# Patient Record
Sex: Male | Born: 1985 | Race: Asian | Hispanic: No | Marital: Single | Smoking: Current every day smoker
Health system: Southern US, Community
[De-identification: ages and names within clinical notes are randomized; demographics above are authoritative.]

## PROBLEM LIST (undated history)

## (undated) DIAGNOSIS — S43006A Unspecified dislocation of unspecified shoulder joint, initial encounter: Secondary | ICD-10-CM

---

## 2012-08-05 ENCOUNTER — Encounter (HOSPITAL_COMMUNITY): Payer: Self-pay | Admitting: Emergency Medicine

## 2012-08-05 ENCOUNTER — Emergency Department (INDEPENDENT_AMBULATORY_CARE_PROVIDER_SITE_OTHER): Payer: Self-pay

## 2012-08-05 ENCOUNTER — Emergency Department (INDEPENDENT_AMBULATORY_CARE_PROVIDER_SITE_OTHER)
Admission: EM | Admit: 2012-08-05 | Discharge: 2012-08-05 | Disposition: A | Discharge status: Home / Self Care | Payer: Self-pay | Source: Home / Self Care

## 2012-08-05 ENCOUNTER — Emergency Department (HOSPITAL_COMMUNITY): Payer: Self-pay

## 2012-08-05 DIAGNOSIS — S43006A Unspecified dislocation of unspecified shoulder joint, initial encounter: Secondary | ICD-10-CM

## 2012-08-05 MED ORDER — HYDROCODONE-ACETAMINOPHEN 5-325 MG PO TABS: 1.0000 | ORAL_TABLET | Freq: Four times a day (QID) | ORAL | Status: DC | PRN

## 2012-08-05 MED ORDER — HYDROMORPHONE HCL PF 1 MG/ML IJ SOLN
1.0000 mg | Freq: Once | INTRAMUSCULAR | Status: AC
  Administered 2012-08-05: 1 mg via INTRAMUSCULAR

## 2012-08-05 MED ORDER — ONDANSETRON HCL 4 MG/2ML IJ SOLN
INTRAMUSCULAR | Status: AC
Start: 2012-08-05 — End: 2012-08-05
  Filled 2012-08-05: qty 2

## 2012-08-05 MED ORDER — ONDANSETRON HCL 4 MG/2ML IJ SOLN
4.0000 mg | Freq: Once | INTRAMUSCULAR | Status: AC
  Administered 2012-08-05: 4 mg via INTRAMUSCULAR

## 2012-08-05 MED ORDER — HYDROMORPHONE HCL PF 1 MG/ML IJ SOLN
INTRAMUSCULAR | Status: AC
  Filled 2012-08-05: qty 1

## 2012-08-05 NOTE — ED Provider Notes (Signed)
Steven Richmond is a 27 y.o. male who presents to Urgent Care today for right shoulder pain. Patient fell last night at 10 PM resulted in an arm and shoulder pain. He presents to the urgent care today with shoulder pain and deformity. He notes severe right shoulder pain and right elbow pain. He denies any radiating pain weakness or numbness. Denies any history of  serious shoulder injury.    PMH reviewed.  History  Substance Use Topics  . Smoking status: Current Every Day Smoker -- 0.50 packs/day    Types: Cigarettes  . Smokeless tobacco: Not on file  . Alcohol Use: Yes     Comment: occasional   ROS as above Medications reviewed. Current Facility-Administered Medications  Medication Dose Route Frequency Provider Last Rate Last Dose  . HYDROmorphone (DILAUDID) injection 1 mg  1 mg Intramuscular Once Rodolph Bong, MD      . ondansetron [facility]) injection 4 mg  4 mg Intramuscular Once Rodolph Bong, MD       No current outpatient prescriptions on file.    Exam:  BP 152/102  Pulse 85  Resp 22  SpO2 100% Gen: Well NAD RIGHT SHOULDER:  Anterior dislocation with sulcus sign and deformity.  Sensation pulses capillary refill intact distal.   Procedure note:  Shoulder reduction.  Consent obtained and timeout performed. Right shoulder sulcus cleaned with Betadine and 10 mL of Marcaine were injected into the glenohumeral space.  Axial traction was applied and the arm is fully abducted 120 and attempted to internally rotate unsuccessfully.  The patient was then positioned sitting up and with his right knee and hip flexed. He interlaced his fingers and leaned back applying axial traction. This was also un-successful.  Axial traction was then manually applied and the arm is abducted to 130 slowly. The arm was then internally rotated to 90. The patient withdrew in pain.   We then took a break and provided the patient with 1 mg of IM allotted and 4 mg of IM Zofran.  Patient was placed supine  with his right arm holding a 15 pound weight.  After 10 minutes patient noted spontaneous reduction.   Normal grip strength and sensation following the reduction.    No results found for this or any previous visit (from the past 24 hour(s)). Dg Shoulder Right  08/05/2012  *RADIOLOGY REPORT*  Clinical Data: Shoulder pain with deformity status post fall 1 day ago.  RIGHT SHOULDER - 2+ VIEW  Comparison: None.  Findings: There is anterior dislocation of the glenohumeral joint. There is a possible Hill-Sachs deformity of the humeral head.  No displaced fracture fragment is identified.  IMPRESSION: Anterior dislocation of the glenohumeral joint with possible associated Hill-Sachs deformity.  Postreduction radiographs recommended.   Original Report Authenticated By: Carey Bullocks, M.D.     Assessment and Plan: 27 y.o. male with right shoulder dislocation with reduction.  Plan: Shoulder immobilizer for one week followup in one week to start internal rotation strengthening program Patient does not have insurance and therefore is unable to followup with orthopedics.       Rodolph Bong, MD 08/05/12 [phone]

## 2012-08-05 NOTE — ED Notes (Signed)
Pt injured right arm last night around 10 p.m, pt states that he fell in the kitchen landing on right arm/shoulder. Pt is unable to move arm/pain felt with movement.

## 2012-08-07 NOTE — ED Provider Notes (Signed)
Medical screening examination/treatment/procedure(s) were performed by resident physician or non-physician practitioner and as supervising physician I was immediately available for consultation/collaboration.   Lilo Wallington DOUGLAS MD.   Amantha Sklar D Shon Mansouri, MD 08/07/12 1120 

## 2012-10-08 ENCOUNTER — Emergency Department (HOSPITAL_COMMUNITY): Payer: Self-pay

## 2012-10-08 ENCOUNTER — Emergency Department (HOSPITAL_COMMUNITY)
Admission: EM | Admit: 2012-10-08 | Discharge: 2012-10-08 | Disposition: A | Discharge status: Home / Self Care | Payer: Self-pay | Attending: Emergency Medicine | Admitting: Emergency Medicine

## 2012-10-08 ENCOUNTER — Encounter (HOSPITAL_COMMUNITY): Payer: Self-pay | Admitting: *Deleted

## 2012-10-08 DIAGNOSIS — S43016A Anterior dislocation of unspecified humerus, initial encounter: Secondary | ICD-10-CM | POA: Insufficient documentation

## 2012-10-08 DIAGNOSIS — S43004D Unspecified dislocation of right shoulder joint, subsequent encounter: Secondary | ICD-10-CM

## 2012-10-08 DIAGNOSIS — M2180 Other specified acquired deformities of unspecified limb: Secondary | ICD-10-CM | POA: Insufficient documentation

## 2012-10-08 DIAGNOSIS — F17200 Nicotine dependence, unspecified, uncomplicated: Secondary | ICD-10-CM | POA: Insufficient documentation

## 2012-10-08 DIAGNOSIS — Y929 Unspecified place or not applicable: Secondary | ICD-10-CM | POA: Insufficient documentation

## 2012-10-08 DIAGNOSIS — X500XXA Overexertion from strenuous movement or load, initial encounter: Secondary | ICD-10-CM | POA: Insufficient documentation

## 2012-10-08 DIAGNOSIS — S40019A Contusion of unspecified shoulder, initial encounter: Secondary | ICD-10-CM | POA: Insufficient documentation

## 2012-10-08 DIAGNOSIS — Y939 Activity, unspecified: Secondary | ICD-10-CM | POA: Insufficient documentation

## 2012-10-08 DIAGNOSIS — M24411 Recurrent dislocation, right shoulder: Secondary | ICD-10-CM

## 2012-10-08 HISTORY — DX: Unspecified dislocation of unspecified shoulder joint, initial encounter: S43.006A

## 2012-10-08 MED ORDER — KETAMINE HCL 10 MG/ML IJ SOLN
10.0000 mg | Freq: Once | INTRAMUSCULAR | Status: AC
  Administered 2012-10-08: 10 mg via INTRAVENOUS
  Filled 2012-10-08: qty 1

## 2012-10-08 MED ORDER — PROPOFOL 10 MG/ML IV BOLUS
0.5000 mg/kg | Freq: Once | INTRAVENOUS | Status: DC
  Filled 2012-10-08: qty 20

## 2012-10-08 MED ORDER — LIDOCAINE HCL (PF) 1 % IJ SOLN
5.0000 mL | Freq: Once | INTRAMUSCULAR | Status: DC
  Filled 2012-10-08: qty 5

## 2012-10-08 MED ORDER — IBUPROFEN 600 MG PO TABS: 600.0000 mg | ORAL_TABLET | Freq: Four times a day (QID) | ORAL | Status: DC | PRN

## 2012-10-08 MED ORDER — PROPOFOL 10 MG/ML IV BOLUS
0.5000 mg/kg | Freq: Once | INTRAVENOUS | Status: AC
  Administered 2012-10-08: 129 mg via INTRAVENOUS
  Filled 2012-10-08: qty 20

## 2012-10-08 NOTE — ED Notes (Signed)
Patient stating he was playing at the playground with family and felt his right shoulder pop out.  Stated [place] and this is the first time while here

## 2012-10-08 NOTE — ED Provider Notes (Addendum)
History     CSN: [id number]  Arrival date & time 10/08/12  0019   First MD Initiated Contact with Patient 10/08/12 0118      Chief Complaint  Patient presents with  . Shoulder Pain    HPI Steven Richmond is a 27 y.o. male with prior anterior shoulder dislocation and Hill-Sachs deformity of the right shoulder who made overhead movement this evening about 6:00 and feels like he dislocated his shoulder again. If he does not move his arm his pain is minimal, he took some ibuprofen with good results and his pain is only worse if he moves it. He is placed his arm in a sling for comfort. He's got no associated numbness or tingling. No numbness over the side of his deltoid.   Past Medical History  Diagnosis Date  . Shoulder dislocation     Right    History reviewed. No pertinent past surgical history.  No family history on file.  History  Substance Use Topics  . Smoking status: Current Every Day Smoker -- 0.50 packs/day    Types: Cigarettes  . Smokeless tobacco: Not on file  . Alcohol Use: Yes     Comment: occasional      Review of Systems At least 10pt or greater review of systems completed and are negative except where specified in the HPI.  Allergies  Review of patient's allergies indicates no known allergies.  Home Medications   Current Outpatient Rx  Name  Route  Sig  Dispense  Refill  . ibuprofen (ADVIL,MOTRIN) 200 MG tablet   Oral   Take 400 mg by mouth every 6 (six) hours as needed for pain.           BP 147/94  Pulse 65  Temp(Src) 98.1 F (36.7 C) (Oral)  Resp 18  SpO2 100%  Physical Exam  Nursing notes reviewed.  Electronic medical record reviewed. VITAL SIGNS:   Filed Vitals:   10/08/12 0036  BP: 147/94  Pulse: 65  Temp: 98.1 F (36.7 C)  TempSrc: Oral  Resp: 18  SpO2: 100%   CONSTITUTIONAL: Awake, oriented, appears non-toxic HENT: Atraumatic, normocephalic, oral mucosa pink and moist, airway patent. Nares patent without drainage.  External ears normal. EYES: Conjunctiva clear, EOMI, PERRLA NECK: Trachea midline, non-tender, supple CARDIOVASCULAR: Normal heart rate, Normal rhythm, No murmurs, rubs, gallops PULMONARY/CHEST: Clear to auscultation, no rhonchi, wheezes, or rales. Symmetrical breath sounds. Non-tender. ABDOMINAL: Non-distended, soft, non-tender - no rebound or guarding.  BS normal. NEUROLOGIC: Non-focal, moving all four extremities, no gross sensory or motor deficits. EXTREMITIES: No clubbing, cyanosis, or edema. A depression on the lateral aspect of the deltoid just below the acromion with visible deformity consistent with anterior shoulder dislocation. Patient's range of motion is severely limited in the shoulder joint secondary to pain. Distally neurovascularly intact. He has no paresthesias on the deltoid. SKIN: Warm, Dry, No erythema, No rash  ED Course  Procedural sedation Date/Time: 10/08/2012 4:12 AM Performed by: Jones Skene Authorized by: Jones Skene Consent: Verbal consent obtained. written consent obtained. Risks and benefits discussed: Risks and benefits were discussed using a Nurse, learning disability for [place]. Consent given by: patient Patient understanding: patient states understanding of the procedure being performed Patient consent: the patient's understanding of the procedure matches consent given Procedure consent: procedure consent matches procedure scheduled Imaging studies: imaging studies available Patient identity confirmed: verbally with patient and arm band Local anesthesia used: yes Local anesthetic: lidocaine 1% without epinephrine Anesthetic total: 5 ml Patient sedated: yes Sedation type:  moderate (conscious) sedation Sedatives: ketamine and propofol Analgesia: ketamine Vitals: Vital signs were monitored during sedation. Patient tolerance: Patient tolerated the procedure well with no immediate complications. Comments: End of Sedation 0425  Reduction of  dislocation Date/Time: 10/08/2012 4:12 AM Performed by: Jones Skene Authorized by: Jones Skene Consent: Verbal consent obtained. Risks and benefits discussed: Risks and benefits conveyed via translator. Consent given by: patient Patient understanding: patient states understanding of the procedure being performed Patient consent: the patient's understanding of the procedure matches consent given Procedure consent: procedure consent matches procedure scheduled Imaging studies: imaging studies available Patient identity confirmed: verbally with patient and arm band Local anesthesia used: yes Local anesthetic: lidocaine 1% without epinephrine Anesthetic total: 5 ml Patient sedated: yes Sedation type: moderate (conscious) sedation Sedatives: ketamine and propofol Analgesia: ketamine Vitals: Vital signs were monitored during sedation. Patient tolerance: Patient tolerated the procedure well with no immediate complications. Comments: Anterior shoulder reduction reduced without complications.    (including critical care time)   END OF PROCEDURAL SEDATION 0425   Labs Reviewed - No data to display Dg Shoulder Right  10/08/2012  *RADIOLOGY REPORT*  Clinical Data: Status post reduction of right shoulder dislocation.  RIGHT SHOULDER - 2+ VIEW  Comparison: Right shoulder radiographs performed earlier today at 05:01 a.m.  Findings: There has been successful reduction of the patient's right humeral head dislocation.  An underlying Hill-Sachs lesion is suspected.  No osseous Bankart lesion is seen.  No new fractures are identified.  The right acromioclavicular joint is unremarkable in appearance. The visualized portions of the lungs are grossly clear.  No significant soft tissue abnormalities are characterized on radiograph.  IMPRESSION: Successful reduction of right humeral head dislocation.  Suspect underlying Hill-Sachs lesion.  No new fractures seen.   Original Report Authenticated By:  Tonia Ghent, M.D.    Dg Shoulder Right  10/08/2012  *RADIOLOGY REPORT*  Clinical Data: Injury to right arm; right shoulder pain.  RIGHT SHOULDER - 2+ VIEW  Comparison: Right shoulder radiograph performed 08/05/2012  Findings: There is recurrent anterior dislocation of the right humeral head.  An underlying Hill-Sachs lesion is seen.  No definite osseous Bankart lesion is identified.  No additional fractures are identified.  The right acromioclavicular joint is unremarkable in appearance.  The visualized portions of the lungs are grossly clear.  IMPRESSION: Recurrent anterior dislocation of the right humeral head, with an underlying Hill-Sachs lesion.  No definite osseous Bankart lesion identified.   Original Report Authenticated By: Tonia Ghent, M.D.    Dg Shoulder Right Port  10/08/2012  *RADIOLOGY REPORT*  Clinical Data: Status post reduction of right humeral head dislocation.  PORTABLE RIGHT SHOULDER - 2+ VIEW  Comparison: Right shoulder radiographs performed earlier today at 01:58 a.m.  Findings: There has been successful reduction of the patient's right humeral head dislocation, though this is difficult to fully characterize on a single view.  No definite fractures are seen. The inferior glenoid appears grossly intact.  The right acromioclavicular joint is unremarkable in appearance. The visualized portions of the lungs are grossly clear.  No significant soft tissue abnormalities are characterized on radiograph.  IMPRESSION: Successful reduction of right humeral head dislocation, difficult to fully characterize on a single view.   Original Report Authenticated By: Tonia Ghent, M.D.      1. Recurrent anterior dislocation of shoulder, right   2. Hill-Sachs deformity with bone bruise, right, subsequent encounter       MDM  Patient presents with recurrent anterior shoulder dislocation, patient had  a previous Hill-Sachs lesion from first dislocation.  Patient sedated uneventfully starting at  0412, shoulder reduced - first x-ray on my read was not convincing of a relocation, did send him for a formal study; these studies were read as relocation by radiologist. Patient had good outcome was able to move his arm without pain, flex and extend, neurovascularly intact, no paresthesias to the lateral deltoid. Patient is urged to followup with Dr. supple of orthopedics.  Keep the patient in a sling with early mobilization.         Jones Skene, MD 10/08/12 1015  Jones Skene, MD 10/18/12 4098  Jones Skene, MD 10/31/12 0800  Jones Skene, MD 11/26/12 1191

## 2012-10-08 NOTE — ED Notes (Signed)
After reviewing the xray, Dr. Rulon Abide has determined that the shoulder is not in place correctly.  2nd consent obtained.

## 2012-10-08 NOTE — ED Notes (Addendum)
Pt here for R shoulder pain, pt wearing ill fitting shoulder immobilizer, here for possible dislocation re-occurrence, was seen at [facility] in february for dislocated R shoulder, possible deformity present, alert, NAD,calm. CMS intact. ROM limited. Language barrier.

## 2012-10-08 NOTE — ED Notes (Signed)
Pre procedure checklist done at 0410

## 2012-12-29 ENCOUNTER — Emergency Department (HOSPITAL_COMMUNITY): Payer: Self-pay

## 2012-12-29 ENCOUNTER — Encounter (HOSPITAL_COMMUNITY): Payer: Self-pay | Admitting: Emergency Medicine

## 2012-12-29 ENCOUNTER — Emergency Department (HOSPITAL_COMMUNITY)
Admission: EM | Admit: 2012-12-29 | Discharge: 2012-12-30 | Disposition: A | Discharge status: Home / Self Care | Payer: Self-pay | Attending: Emergency Medicine | Admitting: Emergency Medicine

## 2012-12-29 DIAGNOSIS — Y929 Unspecified place or not applicable: Secondary | ICD-10-CM | POA: Insufficient documentation

## 2012-12-29 DIAGNOSIS — Y9389 Activity, other specified: Secondary | ICD-10-CM | POA: Insufficient documentation

## 2012-12-29 DIAGNOSIS — S43004A Unspecified dislocation of right shoulder joint, initial encounter: Secondary | ICD-10-CM

## 2012-12-29 DIAGNOSIS — X503XXA Overexertion from repetitive movements, initial encounter: Secondary | ICD-10-CM | POA: Insufficient documentation

## 2012-12-29 DIAGNOSIS — S43006A Unspecified dislocation of unspecified shoulder joint, initial encounter: Secondary | ICD-10-CM | POA: Insufficient documentation

## 2012-12-29 DIAGNOSIS — F17200 Nicotine dependence, unspecified, uncomplicated: Secondary | ICD-10-CM | POA: Insufficient documentation

## 2012-12-29 NOTE — ED Notes (Signed)
PT. REPORTS RIGHT SHOULDER INJURY/PAIN  THIS EVENING WHILE LIFTING TRASH BAG WITH DEFORMITY .

## 2012-12-30 ENCOUNTER — Emergency Department (HOSPITAL_COMMUNITY): Payer: Self-pay

## 2012-12-30 MED ORDER — SODIUM CHLORIDE 0.9 % IV BOLUS (SEPSIS)
1000.0000 mL | Freq: Once | INTRAVENOUS | Status: AC
  Administered 2012-12-30: 1000 mL via INTRAVENOUS

## 2012-12-30 MED ORDER — PROPOFOL 10 MG/ML IV BOLUS
0.5000 mg/kg | Freq: Once | INTRAVENOUS | Status: AC
  Administered 2012-12-30: 30 mg via INTRAVENOUS

## 2012-12-30 MED ORDER — FENTANYL CITRATE 0.05 MG/ML IJ SOLN
50.0000 ug | Freq: Once | INTRAMUSCULAR | Status: AC
  Administered 2012-12-30: 50 ug via INTRAVENOUS
  Filled 2012-12-30: qty 2

## 2012-12-30 MED ORDER — PROPOFOL 10 MG/ML IV EMUL
INTRAVENOUS | Status: AC
  Filled 2012-12-30: qty 100

## 2012-12-30 NOTE — ED Provider Notes (Signed)
History    CSN: [id number] Arrival date & time 12/29/12  2257  First MD Initiated Contact with Patient 12/30/12 0020     Chief Complaint  Patient presents with  . Shoulder Pain   (Consider location/radiation/quality/duration/timing/severity/associated sxs/prior Treatment) Patient is a 27 y.o. male presenting with shoulder pain.  Shoulder Pain   PT with history of R shoulder dislocation x 2 reports he was lifting something heavy overhead into a trash dumpster just prior to arrival and felt his Right shoulder come out. Complaining of moderate aching pain, worse with movement.   Past Medical History  Diagnosis Date  . Shoulder dislocation     Right   History reviewed. No pertinent past surgical history. No family history on file. History  Substance Use Topics  . Smoking status: Current Every Day Smoker -- 0.50 packs/day    Types: Cigarettes  . Smokeless tobacco: Not on file  . Alcohol Use: Yes     Comment: occasional    Review of Systems All other systems reviewed and are negative except as noted in HPI.   Allergies  Review of patient's allergies indicates no known allergies.  Home Medications   Current Outpatient Rx  Name  Route  Sig  Dispense  Refill  . ibuprofen (ADVIL,MOTRIN) 200 MG tablet   Oral   Take 400 mg by mouth every 6 (six) hours as needed for pain.          BP 130/91  Pulse 64  Temp(Src) 98.3 F (36.8 C) (Oral)  Resp 18  SpO2 100% Physical Exam  Nursing note and vitals reviewed. Constitutional: He is oriented to person, place, and time. He appears well-developed and well-nourished.  HENT:  Head: Normocephalic and atraumatic.  Eyes: EOM are normal. Pupils are equal, round, and reactive to light.  Neck: Normal range of motion. Neck supple.  Cardiovascular: Normal rate, normal heart sounds and intact distal pulses.   Pulmonary/Chest: Effort normal and breath sounds normal.  Abdominal: Bowel sounds are normal. He exhibits no distension. There  is no tenderness.  Musculoskeletal: He exhibits tenderness. He exhibits no edema.  R shoulder deformity, tender to palpation  Neurological: He is alert and oriented to person, place, and time. He has normal strength. No cranial nerve deficit.  ?mild paresthesia to right deltoid compared to left  Skin: Skin is warm and dry. No rash noted.  Psychiatric: He has a normal mood and affect.    ED Course  ORTHOPEDIC INJURY TREATMENT Date/Time: 12/30/2012 2:02 AM Performed by: Susy Frizzle B. Authorized by: Pollyann Savoy Consent: Verbal consent obtained. written consent obtained. Risks and benefits: risks, benefits and alternatives were discussed Consent given by: patient Patient identity confirmed: verbally with patient and arm band Time out: Immediately prior to procedure a "time out" was called to verify the correct patient, procedure, equipment, support staff and site/side marked as required. Injury location: shoulder Location details: right shoulder Injury type: dislocation Dislocation type: anterior Hill-Sachs deformity: no Pre-procedure neurovascular assessment: neurovascularly intact Patient sedated: yes Sedatives: propofol Analgesia: fentanyl Manipulation performed: yes Reduction method: traction and counter traction Reduction successful: yes X-ray confirmed reduction: yes Immobilization: sling Post-procedure neurovascular assessment: post-procedure neurovascularly intact Post-procedure distal perfusion: normal Post-procedure neurological function: normal   (including critical care time) Labs Reviewed - No data to display Dg Shoulder Right  12/29/2012   *RADIOLOGY REPORT*  Clinical Data: Right shoulder pain post trauma, history dislocation  RIGHT SHOULDER - 2+ VIEW  Comparison: 10/08/2012  Findings: Osseous mineralization normal. AC  joint alignment normal. Anterior right glenohumeral dislocation. No definite fracture identified. Visualized right ribs intact.   IMPRESSION: Anterior right glenohumeral dislocation.   Original Report Authenticated By: Ulyses Southward, M.D.   Dg Shoulder Right Port  12/30/2012   *RADIOLOGY REPORT*  Clinical Data: Right shoulder postreduction.  PORTABLE RIGHT SHOULDER - 2+ VIEW  Comparison: 12/29/2012  Findings: Interval relocation of the right shoulder since previous study.  Glenohumeral joint appears intact.  Acromioclavicular and coracoclavicular spaces are maintained.  No focal bone lesion appreciated.  IMPRESSION: Interval reduction of previous right shoulder dislocation is demonstrated.   Original Report Authenticated By: Burman Nieves, M.D.   1. Shoulder dislocation, right, initial encounter     MDM  Attempted reduction with slow external rotation and adduction without success. Will set up procedural sedation.   Nickholas Goldston B. Bernette Mayers, MD 12/30/12 [phone]

## 2013-01-05 ENCOUNTER — Emergency Department (HOSPITAL_COMMUNITY): Payer: Self-pay

## 2013-01-05 ENCOUNTER — Emergency Department (HOSPITAL_COMMUNITY)
Admission: EM | Admit: 2013-01-05 | Discharge: 2013-01-05 | Disposition: A | Discharge status: Home / Self Care | Payer: Self-pay | Attending: Emergency Medicine | Admitting: Emergency Medicine

## 2013-01-05 ENCOUNTER — Encounter (HOSPITAL_COMMUNITY): Payer: Self-pay | Admitting: *Deleted

## 2013-01-05 DIAGNOSIS — Y929 Unspecified place or not applicable: Secondary | ICD-10-CM | POA: Insufficient documentation

## 2013-01-05 DIAGNOSIS — W19XXXA Unspecified fall, initial encounter: Secondary | ICD-10-CM | POA: Insufficient documentation

## 2013-01-05 DIAGNOSIS — F17200 Nicotine dependence, unspecified, uncomplicated: Secondary | ICD-10-CM | POA: Insufficient documentation

## 2013-01-05 DIAGNOSIS — Y939 Activity, unspecified: Secondary | ICD-10-CM | POA: Insufficient documentation

## 2013-01-05 DIAGNOSIS — S43006A Unspecified dislocation of unspecified shoulder joint, initial encounter: Secondary | ICD-10-CM | POA: Insufficient documentation

## 2013-01-05 DIAGNOSIS — M24411 Recurrent dislocation, right shoulder: Secondary | ICD-10-CM

## 2013-01-05 MED ORDER — HYDROCODONE-ACETAMINOPHEN 5-325 MG PO TABS: 1.0000 | ORAL_TABLET | ORAL | Status: DC | PRN

## 2013-01-05 MED ORDER — MORPHINE SULFATE 4 MG/ML IJ SOLN
4.0000 mg | Freq: Once | INTRAMUSCULAR | Status: AC
  Administered 2013-01-05: 4 mg via INTRAVENOUS
  Filled 2013-01-05: qty 1

## 2013-01-05 MED ORDER — DIAZEPAM 5 MG PO TABS
10.0000 mg | ORAL_TABLET | Freq: Once | ORAL | Status: AC
  Administered 2013-01-05: 10 mg via ORAL
  Filled 2013-01-05: qty 2

## 2013-01-05 MED ORDER — KETOROLAC TROMETHAMINE 60 MG/2ML IM SOLN
60.0000 mg | Freq: Once | INTRAMUSCULAR | Status: AC
  Administered 2013-01-05: 60 mg via INTRAMUSCULAR
  Filled 2013-01-05: qty 2

## 2013-01-05 MED ORDER — SODIUM CHLORIDE 0.9 % IV SOLN
Freq: Once | INTRAVENOUS | Status: AC
  Administered 2013-01-05: 16:00:00 via INTRAVENOUS

## 2013-01-05 MED ORDER — PROPOFOL 10 MG/ML IV BOLUS
200.0000 mg | Freq: Once | INTRAVENOUS | Status: DC
  Filled 2013-01-05: qty 20

## 2013-01-05 MED ORDER — PROPOFOL 10 MG/ML IV BOLUS
INTRAVENOUS | Status: AC | PRN
  Administered 2013-01-05: 40 mg via INTRAVENOUS

## 2013-01-05 NOTE — ED Provider Notes (Signed)
   History    CSN: [id number] Arrival date & time 01/05/13  1059  First MD Initiated Contact with Patient 01/05/13 1118     Chief Complaint  Patient presents with  . Shoulder Pain   (Consider location/radiation/quality/duration/timing/severity/associated sxs/prior Treatment) Patient is a 27 y.o. male presenting with shoulder pain. The history is provided by the patient. A language interpreter was used (Family member at bedside.).  Shoulder Pain Pertinent negatives include no chills, fever or numbness. Associated symptoms comments: Recurrent right shoulder pain upon waking this morning. He has had multiple right shoulder dislocations in the last several months and is following up with Dr. Rennis Chris this month for further management. .   Past Medical History  Diagnosis Date  . Shoulder dislocation     Right   History reviewed. No pertinent past surgical history. No family history on file. History  Substance Use Topics  . Smoking status: Current Every Day Smoker -- 0.50 packs/day    Types: Cigarettes  . Smokeless tobacco: Not on file  . Alcohol Use: Yes     Comment: occasional    Review of Systems  Constitutional: Negative for fever and chills.  HENT: Negative.   Musculoskeletal:       See HPI  Skin: Negative.  Negative for color change.  Neurological: Negative.  Negative for numbness.    Allergies  Review of patient's allergies indicates no known allergies.  Home Medications   Current Outpatient Rx  Name  Route  Sig  Dispense  Refill  . ibuprofen (ADVIL,MOTRIN) 200 MG tablet   Oral   Take 400 mg by mouth every 6 (six) hours as needed for pain.          BP 123/84  Pulse 59  Temp(Src) 98.1 F (36.7 C) (Oral)  Resp 22  SpO2 100% Physical Exam  Constitutional: He is oriented to person, place, and time. He appears well-developed and well-nourished.  Neck: Normal range of motion.  Pulmonary/Chest: Effort normal.  Abdominal: Soft.  Musculoskeletal: Normal range of  motion.  Right shoulder AC dropoff. No swelling or discoloration. ROM limited by pain. Distal grip 5/5 strength.  Neurological: He is alert and oriented to person, place, and time.  Skin: Skin is warm and dry.  Psychiatric: He has a normal mood and affect.    ED Course  Procedures (including critical care time) Labs Reviewed - No data to display Dg Shoulder Right  01/05/2013   *RADIOLOGY REPORT*  Clinical Data: Shoulder pain  RIGHT SHOULDER - 2+ VIEW  Comparison: 12/30/2012  Findings: Anterior shoulder dislocation.  Small Hill-Sachs deformity of the humeral head.  No acute fracture.  IMPRESSION: Anterior shoulder dislocation.   Original Report Authenticated By: Janeece Riggers, M.D.   Dg Shoulder Right Port  01/05/2013   *RADIOLOGY REPORT*  Clinical Data: Reduction of right shoulder dislocation.  PORTABLE RIGHT SHOULDER - 2+ VIEW  Comparison: Plain films right shoulder earlier this same date.  Findings: The shoulder appears reduced.  Hill-Sachs deformity is noted.  IMPRESSION: Successful reduction of dislocation with a Hill-Sachs deformity noted.   Original Report Authenticated By: Holley Dexter, M.D.   No diagnosis found. 1. Shoulder dislocation, right MDM  Patient given oral Valium with pain relief and appeared more relaxed. Attempt at reduction of shoulder dislocation unsuccessful prior to sedation including weighted traction for reduction. Patient was procedurally sedated and reduction with uncomplicated.   Arnoldo Hooker, PA-C 01/05/13 1747

## 2013-01-05 NOTE — ED Notes (Signed)
Patient transported to X-ray 

## 2013-01-05 NOTE — Progress Notes (Signed)
Orthopedic Tech Progress Note Patient Details:  Steven Richmond 02-23-1986 [id number]  Musculoskeletal Traction Type of Traction: Other (Comment) Traction Location: shoulder traction Traction Weight: 20 lbs    Shawnie Pons 01/05/2013, 2:20 PM

## 2013-01-05 NOTE — ED Notes (Signed)
Placed pt on two liters of oxygen (nasal cannula)

## 2013-01-05 NOTE — ED Notes (Signed)
Reports awoke this morning with shoulder dislocated. Denies injury. Last seen in ED for same 12-29-12. Reports has an appt with Ortho MD 01-15-13

## 2013-01-05 NOTE — ED Provider Notes (Signed)
See prior note   Ward Givens, MD 01/05/13 1753

## 2013-01-05 NOTE — ED Provider Notes (Addendum)
Pt has hx of right shoulder dislocation since February about 3 times. He has an appt with orthopedics soon. He woke up today with his shoulder out again. Pt is right handed.   Pt has obvious step off of his right shoulder and the humeral head is felt just under the clavicle. Sensation intact.   PA unable to get shoulder reduced. Pt prepared for propofol.  Procedural sedation Performed by: Devoria Albe L Consent: Verbal and written consent obtained. Risks and benefits: risks, benefits and alternatives were discussed Required items: required blood products, implants, devices, and special equipment available Patient identity confirmed: arm band and provided demographic data Time out: Immediately prior to procedure a "time out" was called to verify the correct patient, procedure, equipment, support staff and site/side marked as required.  Sedation type: moderate (conscious) sedation NPO time confirmed and considedered  Sedatives: PROPOFOL Pt given 60 mg with good sedation. His right arm was hyperextended and a distinct pop felt and his arm now moves freely, no more step-offs felt. Pt awake at 16:25 and states his pain is better.  Physician Time at Bedside: 15 min  Vitals: Vital signs were monitored during sedation. Cardiac Monitor, pulse oximeter Patient tolerance: Patient tolerated the procedure well with no immediate complications. Comments: Pt with uneventful recovered. Returned to pre-procedural sedation baseline Post reduction xray ordered  Dg Shoulder Right  01/05/2013   *RADIOLOGY REPORT*  Clinical Data: Shoulder pain  RIGHT SHOULDER - 2+ VIEW  Comparison: 12/30/2012  Findings: Anterior shoulder dislocation.  Small Hill-Sachs deformity of the humeral head.  No acute fracture.  IMPRESSION: Anterior shoulder dislocation.   Original Report Authenticated By: Janeece Riggers, M.D.   Dg Shoulder Right Port  01/05/2013   *RADIOLOGY REPORT*  Clinical Data: Reduction of right shoulder dislocation.   PORTABLE RIGHT SHOULDER - 2+ VIEW  Comparison: Plain films right shoulder earlier this same date.  Findings: The shoulder appears reduced.  Hill-Sachs deformity is noted.  IMPRESSION: Successful reduction of dislocation with a Hill-Sachs deformity noted.   Original Report Authenticated By: Holley Dexter, M.D.     Devoria Albe, MD, FACEP    Medical screening examination/treatment/procedure(s) were conducted as a shared visit with non-physician practitioner(s) and myself.  I personally evaluated the patient during the encounter    Ward Givens, MD 01/05/13 1421  Ward Givens, MD 01/05/13 1753

## 2013-01-05 NOTE — ED Notes (Signed)
Right shoulder immobilizer placed by Ortho Tech. Tolerated well.

## 2013-01-05 NOTE — ED Notes (Signed)
Placed on monitor, oxygen, airway cart at bedside. Consent obtained

## 2013-01-05 NOTE — ED Notes (Signed)
Portable xray completed

## 2013-01-05 NOTE — ED Notes (Signed)
Pt is here with right shoulder pain and appears dislocated.  Pt had this happen about a couple of months ago after falling, but patient woke up this way today

## 2013-04-29 ENCOUNTER — Emergency Department (HOSPITAL_COMMUNITY)
Admission: EM | Admit: 2013-04-29 | Discharge: 2013-04-29 | Disposition: A | Discharge status: Home / Self Care | Payer: Self-pay | Attending: Emergency Medicine | Admitting: Emergency Medicine

## 2013-04-29 ENCOUNTER — Emergency Department (HOSPITAL_COMMUNITY): Payer: Self-pay

## 2013-04-29 ENCOUNTER — Encounter (HOSPITAL_COMMUNITY): Payer: Self-pay | Admitting: Emergency Medicine

## 2013-04-29 DIAGNOSIS — F17200 Nicotine dependence, unspecified, uncomplicated: Secondary | ICD-10-CM | POA: Insufficient documentation

## 2013-04-29 DIAGNOSIS — S43004A Unspecified dislocation of right shoulder joint, initial encounter: Secondary | ICD-10-CM

## 2013-04-29 DIAGNOSIS — Y9389 Activity, other specified: Secondary | ICD-10-CM | POA: Insufficient documentation

## 2013-04-29 DIAGNOSIS — R319 Hematuria, unspecified: Secondary | ICD-10-CM | POA: Insufficient documentation

## 2013-04-29 DIAGNOSIS — Y9289 Other specified places as the place of occurrence of the external cause: Secondary | ICD-10-CM | POA: Insufficient documentation

## 2013-04-29 DIAGNOSIS — X500XXA Overexertion from strenuous movement or load, initial encounter: Secondary | ICD-10-CM | POA: Insufficient documentation

## 2013-04-29 DIAGNOSIS — S43006A Unspecified dislocation of unspecified shoulder joint, initial encounter: Secondary | ICD-10-CM | POA: Insufficient documentation

## 2013-04-29 MED ORDER — SODIUM CHLORIDE 0.9 % IV BOLUS (SEPSIS)
1000.0000 mL | Freq: Once | INTRAVENOUS | Status: AC
  Administered 2013-04-29: 1000 mL via INTRAVENOUS

## 2013-04-29 MED ORDER — ETOMIDATE 2 MG/ML IV SOLN
INTRAVENOUS | Status: AC | PRN
  Administered 2013-04-29: 11 mg via INTRAVENOUS

## 2013-04-29 MED ORDER — PROPOFOL 10 MG/ML IV BOLUS
2.0000 mg/kg | Freq: Once | INTRAVENOUS | Status: DC
  Filled 2013-04-29: qty 20

## 2013-04-29 MED ORDER — ETOMIDATE 2 MG/ML IV SOLN: 11.0000 mg | Freq: Once | INTRAVENOUS | Status: DC

## 2013-04-29 MED ORDER — ETOMIDATE 2 MG/ML IV SOLN
INTRAVENOUS | Status: AC
  Filled 2013-04-29: qty 10

## 2013-04-29 MED ORDER — HYDROMORPHONE HCL PF 1 MG/ML IJ SOLN
1.0000 mg | Freq: Once | INTRAMUSCULAR | Status: AC
  Administered 2013-04-29: 1 mg via INTRAVENOUS
  Filled 2013-04-29: qty 1

## 2013-04-29 NOTE — ED Notes (Signed)
Called ortho for sling.  

## 2013-04-29 NOTE — Progress Notes (Signed)
Orthopedic Tech Progress Note Patient Details:  Steven Richmond 09/16/1985 [id number]  Ortho Devices Type of Ortho Device: Arm sling Ortho Device/Splint Location: rue Ortho Device/Splint Interventions: Application   Nikki Dom 04/29/2013, 2:29 PM

## 2013-04-29 NOTE — ED Notes (Signed)
Pt is from [place]. Has had multiple fractures of both arms playing soccer. This am, fell in parking lot at work. C/o right upper arm pain. Both arms appear deformed from previous breaks.
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
## 2013-05-04 NOTE — ED Provider Notes (Signed)
Medical screening examination/treatment/procedure(s) were conducted as a shared visit with non-physician practitioner(s) and myself.  I personally evaluated the patient during the encounter.  Tolerated reduction well. Sling and ortho follow up  Procedural sedation Performed by: Lyanne Co Consent: Verbal consent obtained. Risks and benefits: risks, benefits and alternatives were discussed Required items: required blood products, implants, devices, and special equipment available Patient identity confirmed: arm band and provided demographic data Time out: Immediately prior to procedure a "time out" was called to verify the correct patient, procedure, equipment, support staff and site/side marked as required. Sedation type: moderate (conscious) sedation NPO time confirmed and considedered Sedatives: ETOMIDATE Physician Time at Bedside: 16 Vitals: Vital signs were monitored during sedation. Cardiac Monitor, pulse oximeter Patient tolerance: Patient tolerated the procedure well with no immediate complications. Comments: Pt with uneventful recovered. Returned to pre-procedural sedation baseline   Reduction of dislocation Date/Time: 11/25/2012 11:41 AM Performed by: Lyanne Co Authorized by: Lyanne Co Consent: Verbal consent obtained. Risks and benefits: risks, benefits and alternatives were discussed Consent given by: patient Required items: required blood products, implants, devices, and special equipment available Time out: Immediately prior to procedure a "time out" was called to verify the correct patient, procedure, equipment, support staff and site/side marked as required. Patient sedated: etomidate, see note Vitals: Vital signs were monitored during sedation. Patient tolerance: Patient tolerated the procedure well with no immediate complications. Joint: right shoulder Reduction technique: manipulation        Lyanne Co, MD 05/04/13 [phone]

## 2014-02-09 ENCOUNTER — Encounter (HOSPITAL_COMMUNITY): Payer: Self-pay | Admitting: Emergency Medicine

## 2014-02-09 ENCOUNTER — Emergency Department (HOSPITAL_COMMUNITY): Payer: No Typology Code available for payment source

## 2014-02-09 ENCOUNTER — Emergency Department (HOSPITAL_COMMUNITY)
Admission: EM | Admit: 2014-02-09 | Discharge: 2014-02-09 | Disposition: A | Discharge status: Home / Self Care | Payer: No Typology Code available for payment source | Attending: Emergency Medicine | Admitting: Emergency Medicine

## 2014-02-09 DIAGNOSIS — S32009A Unspecified fracture of unspecified lumbar vertebra, initial encounter for closed fracture: Secondary | ICD-10-CM | POA: Insufficient documentation

## 2014-02-09 DIAGNOSIS — Y9389 Activity, other specified: Secondary | ICD-10-CM | POA: Diagnosis not present

## 2014-02-09 DIAGNOSIS — S3981XA Other specified injuries of abdomen, initial encounter: Secondary | ICD-10-CM | POA: Diagnosis not present

## 2014-02-09 DIAGNOSIS — Y92410 Unspecified street and highway as the place of occurrence of the external cause: Secondary | ICD-10-CM | POA: Insufficient documentation

## 2014-02-09 DIAGNOSIS — S298XXA Other specified injuries of thorax, initial encounter: Secondary | ICD-10-CM | POA: Diagnosis not present

## 2014-02-09 DIAGNOSIS — S32010A Wedge compression fracture of first lumbar vertebra, initial encounter for closed fracture: Secondary | ICD-10-CM

## 2014-02-09 DIAGNOSIS — F17200 Nicotine dependence, unspecified, uncomplicated: Secondary | ICD-10-CM | POA: Diagnosis not present

## 2014-02-09 DIAGNOSIS — IMO0002 Reserved for concepts with insufficient information to code with codable children: Secondary | ICD-10-CM | POA: Insufficient documentation

## 2014-02-09 MED ORDER — OXYCODONE-ACETAMINOPHEN 5-325 MG PO TABS
2.0000 | ORAL_TABLET | Freq: Once | ORAL | Status: AC
  Administered 2014-02-09: 2 via ORAL
  Filled 2014-02-09: qty 2

## 2014-02-09 MED ORDER — OXYCODONE-ACETAMINOPHEN 5-325 MG PO TABS: 1.0000 | ORAL_TABLET | Freq: Four times a day (QID) | ORAL | Status: AC | PRN

## 2014-02-09 NOTE — ED Notes (Signed)
Pt getting in gown 

## 2014-02-09 NOTE — ED Notes (Signed)
Patient transported to X-ray 
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
## 2014-02-09 NOTE — Discharge Instructions (Signed)
Please follow up with your primary care physician in 1-2 days. If you do not have one please call the [facility] number listed above. Please follow up with Dr. Lajoyce Cornersuda to schedule a follow up appointment. Please take pain medication and/or muscle relaxants as prescribed and as needed for pain. Please do not drive on narcotic pain medication or on muscle relaxants. Please read all discharge instructions and return precautions.    Back, Compression Fracture A compression fracture happens when a force is put upon the length of your spine. Slipping and falling on your bottom are examples of such a force. When this happens, sometimes the force is great enough to compress the building blocks (vertebral bodies) of your spine. Although this causes a lot of pain, this can usually be treated at home, unless your caregiver feels hospitalization is needed for pain control. Your backbone (spinal column) is made up of 24 main vertebral bodies in addition to the sacrum and coccyx (see illustration). These are held together by tough fibrous tissues (ligaments) and by support of your muscles. Nerve roots pass through the openings between the vertebrae. A sudden wrenching move, injury, or a fall may cause a compression fracture of one of the vertebral bodies. This may result in back pain or spread of pain into the belly (abdomen), the buttocks, and down the leg into the foot. Pain may also be created by muscle spasm alone. Large studies have been undertaken to determine the best possible course of action to help your back following injury and also to prevent future problems. The recommendations are as follows. FOLLOWING A COMPRESSION FRACTURE: Do the following only if advised by your caregiver.   If a back brace has been suggested or provided, wear it as directed.  Do not stop wearing the back brace unless instructed by your caregiver.  When allowed to return to regular activities, avoid a sedentary  lifestyle. Actively exercise. Sporadic weekend binges of tennis, racquetball, or waterskiing may actually aggravate or create problems, especially if you are not in condition for that activity.  Avoid sports requiring sudden body movements until you are in condition for them. Swimming and walking are safer activities.  Maintain good posture.  Avoid obesity.  If not already done, you should have a DEXA scan. Based on the results, be treated for osteoporosis. FOLLOWING ACUTE (SUDDEN) INJURY:  Only take over-the-counter or prescription medicines for pain, discomfort, or fever as directed by your caregiver.  Use bed rest for only the most extreme acute episode. Prolonged bed rest may aggravate your condition. Ice used for acute conditions is effective. Use a large plastic bag filled with ice. Wrap it in a towel. This also provides excellent pain relief. This may be continuous. Or use it for 30 minutes every 2 hours during acute phase, then as needed. Heat for 30 minutes prior to activities is helpful.  As soon as the acute phase (the time when your back is too painful for you to do normal activities) is over, it is important to resume normal activities and work Arboriculturisthardening programs. Back injuries can cause potentially marked changes in lifestyle. So it is important to attack these problems aggressively.  See your caregiver for continued problems. He or she can help or refer you for appropriate exercises, physical therapy, and work hardening if needed.  If you are given narcotic medications for your condition, for the next 24 hours do not:  Drive.  Operate machinery or power tools.  Sign legal  documents.  Do not drink alcohol, or take sleeping pills or other medications that may interfere with treatment. If your caregiver has given you a follow-up appointment, it is very important to keep that appointment. Not keeping the appointment could result in a chronic or permanent injury, pain, and  disability. If there is any problem keeping the appointment, you must call back to this facility for assistance.  SEEK IMMEDIATE MEDICAL CARE IF:  You develop numbness, tingling, weakness, or problems with the use of your arms or legs.  You develop severe back pain not relieved with medications.  You have changes in bowel or bladder control.  You have increasing pain in any areas of the body. Document Released: 06/13/2005 Document Revised: 10/28/2013 Document Reviewed: 01/16/2008 [facility] Patient Information 2015 [place], [place]. This information is not intended to replace advice given to you by your health care provider. Make sure you discuss any questions you have with your health care provider. Motor Vehicle Collision It is common to have multiple bruises and sore muscles after a motor vehicle collision (MVC). These tend to feel worse for the first 24 hours. You may have the most stiffness and soreness over the first several hours. You may also feel worse when you wake up the first morning after your collision. After this point, you will usually begin to improve with each day. The speed of improvement often depends on the severity of the collision, the number of injuries, and the location and nature of these injuries. HOME CARE INSTRUCTIONS  Put ice on the injured area.  Put ice in a plastic bag.  Place a towel between your skin and the bag.  Leave the ice on for 15-20 minutes, 3-4 times a day, or as directed by your health care provider.  Drink enough fluids to keep your urine clear or pale yellow. Do not drink alcohol.  Take a warm shower or bath once or twice a day. This will increase blood flow to sore muscles.  You may return to activities as directed by your caregiver. Be careful when lifting, as this may aggravate neck or back pain.  Only take over-the-counter or prescription medicines for pain, discomfort, or fever as directed by your caregiver. Do not use aspirin. This may  increase bruising and bleeding. SEEK IMMEDIATE MEDICAL CARE IF:  You have numbness, tingling, or weakness in the arms or legs.  You develop severe headaches not relieved with medicine.  You have severe neck pain, especially tenderness in the middle of the back of your neck.  You have changes in bowel or bladder control.  There is increasing pain in any area of the body.  You have shortness of breath, light-headedness, dizziness, or fainting.  You have chest pain.  You feel sick to your stomach (nauseous), throw up (vomit), or sweat.  You have increasing abdominal discomfort.  There is blood in your urine, stool, or vomit.  You have pain in your shoulder (shoulder strap areas).  You feel your symptoms are getting worse. MAKE SURE YOU:  Understand these instructions.  Will watch your condition.  Will get help right away if you are not doing well or get worse. Document Released: 06/13/2005 Document Revised: 10/28/2013 Document Reviewed: 11/10/2010 [facility] Patient Information 2015 [place], [place]. This information is not intended to replace advice given to you by your health care provider. Make sure you discuss any questions you have with your health care provider.

## 2014-02-12 NOTE — ED Provider Notes (Signed)
Medical screening examination/treatment/procedure(s) were performed by non-physician practitioner and as supervising physician I was immediately available for consultation/collaboration.  Abdoulaye Drum T Dawud Mays, MD 02/12/14 1003 

## 2014-05-10 IMAGING — CR DG SHOULDER 2+V*R*
3 series · 3 of 3 positions shown · non-contrast
Comparison: Right shoulder radiograph performed 08/05/2012

CLINICAL DATA: Injury to right arm; right shoulder pain.

RIGHT SHOULDER - 2+ VIEW

[w shoulder external right]
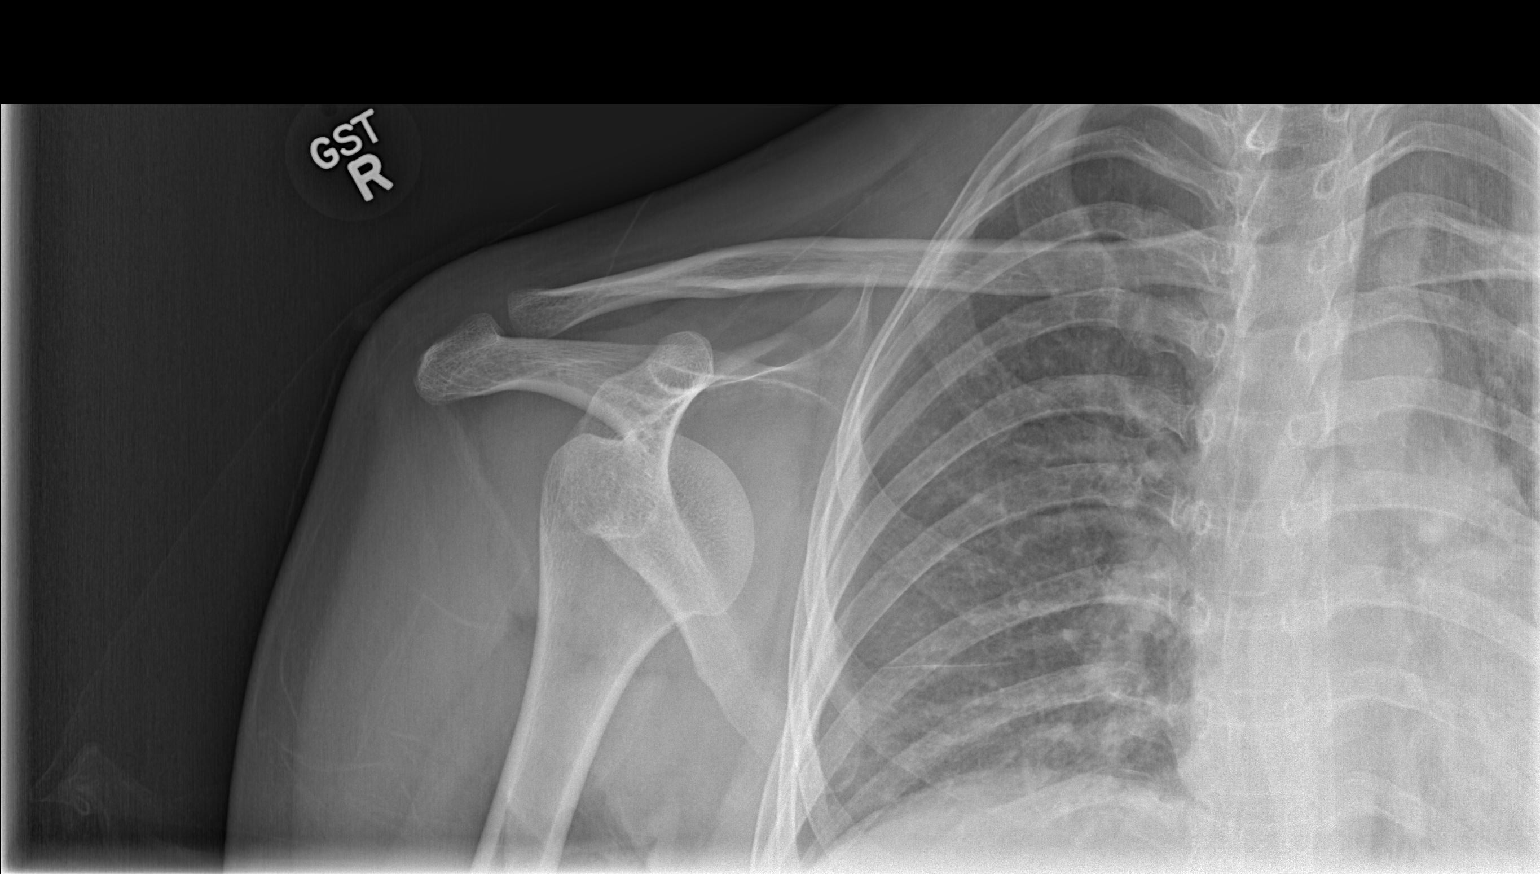

[w shoulder internal right]
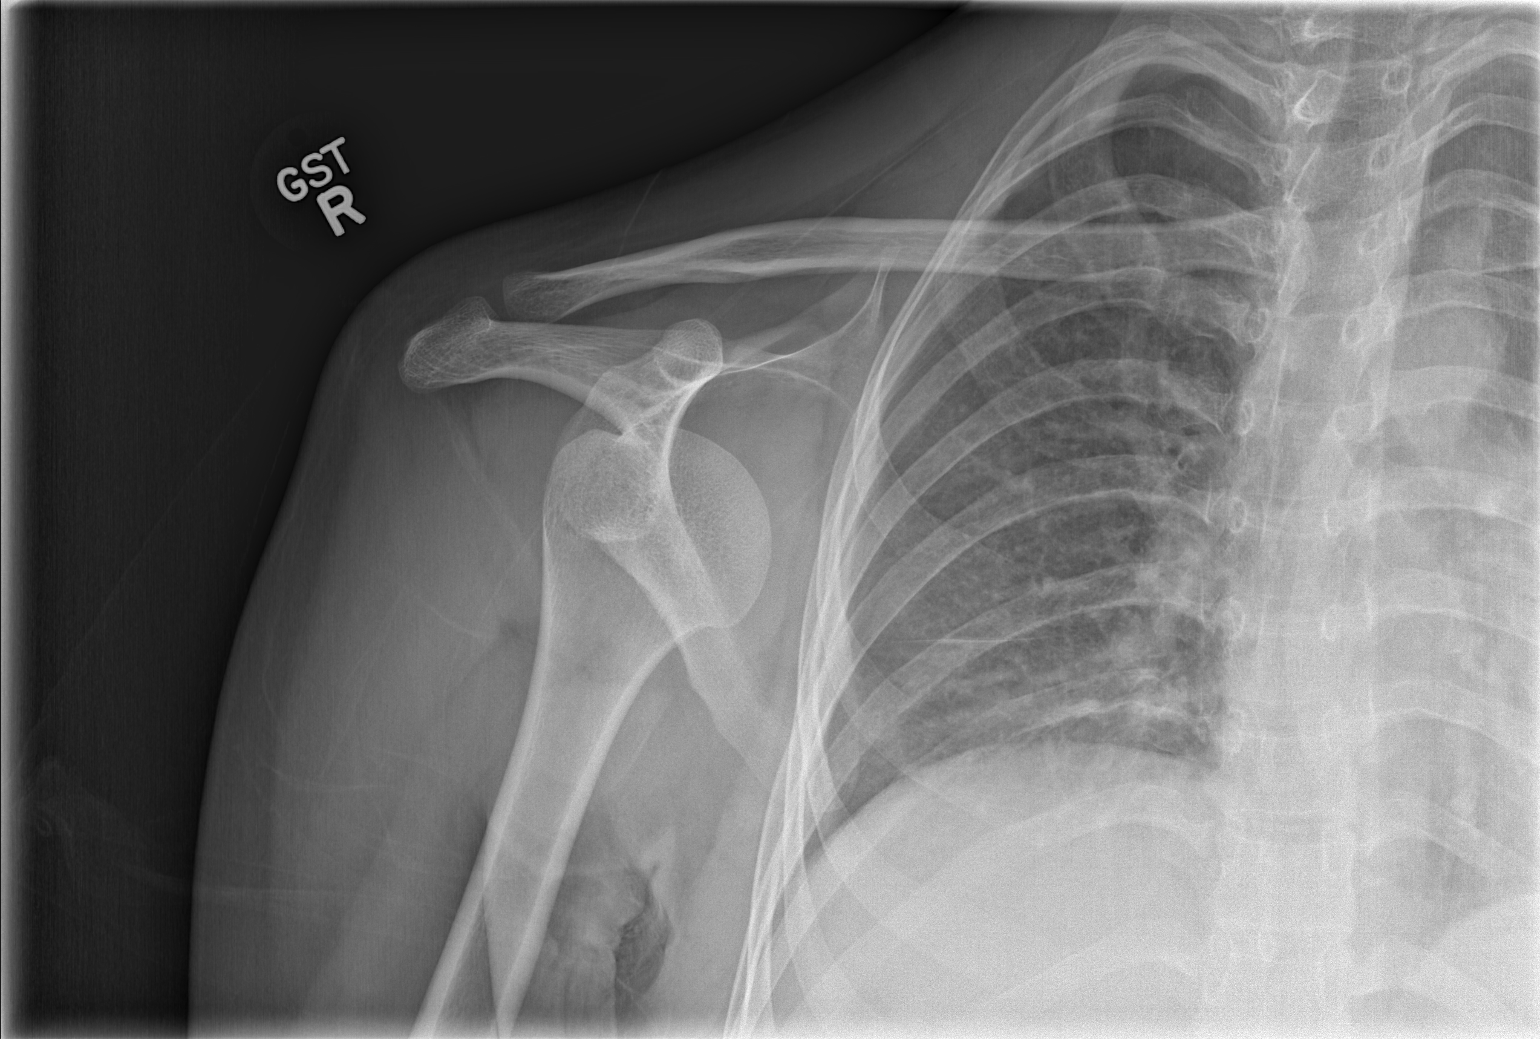

[w shoulder y-view right]
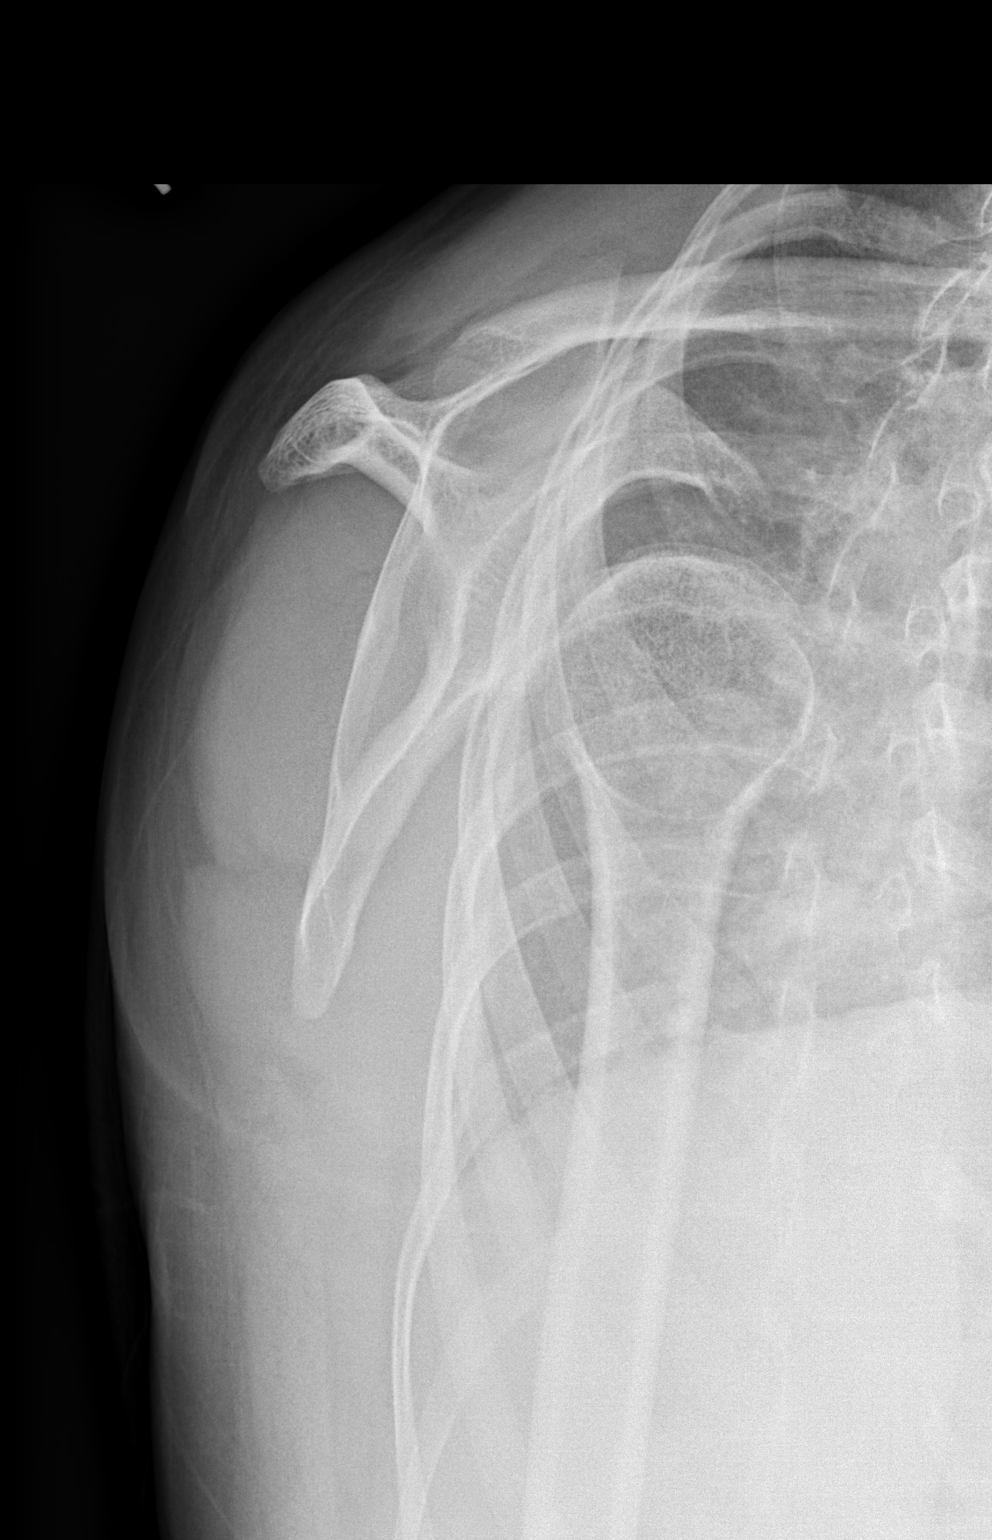

[3 of 3 positions shown; findings below may reference images not displayed]

FINDINGS: There is recurrent anterior dislocation of the right
humeral head.  An underlying Hill-Sachs lesion is seen.  No
definite osseous Bankart lesion is identified.

No additional fractures are identified.  The right
acromioclavicular joint is unremarkable in appearance.  The
visualized portions of the lungs are grossly clear.
IMPRESSION: Recurrent anterior dislocation of the right humeral head, with an
underlying Hill-Sachs lesion.  No definite osseous Bankart lesion
identified.

## 2014-07-31 IMAGING — CR DG SHOULDER 2+V*R*
2 series · 2 of 2 positions shown · non-contrast
Comparison: 10/08/2012

CLINICAL DATA: Right shoulder pain post trauma, history dislocation

RIGHT SHOULDER - 2+ VIEW

[w shoulder ap internal righ]
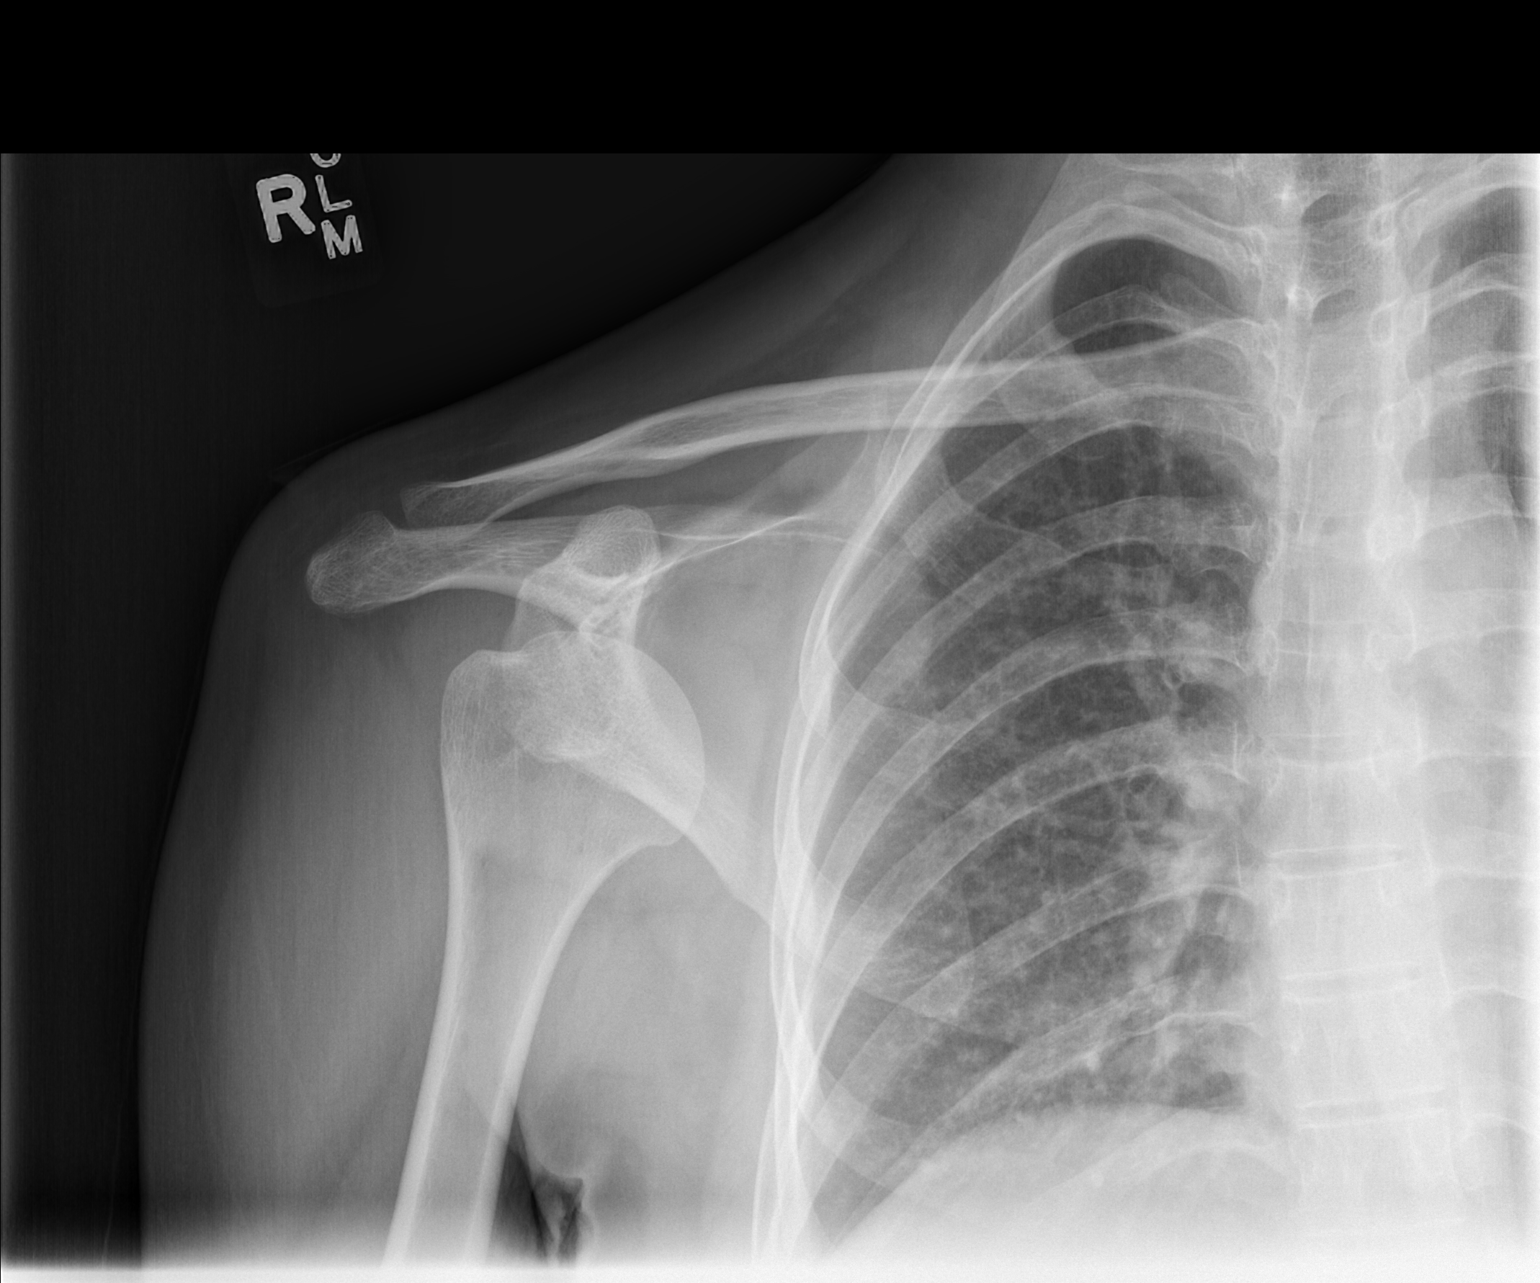

[w shoulder y view right]
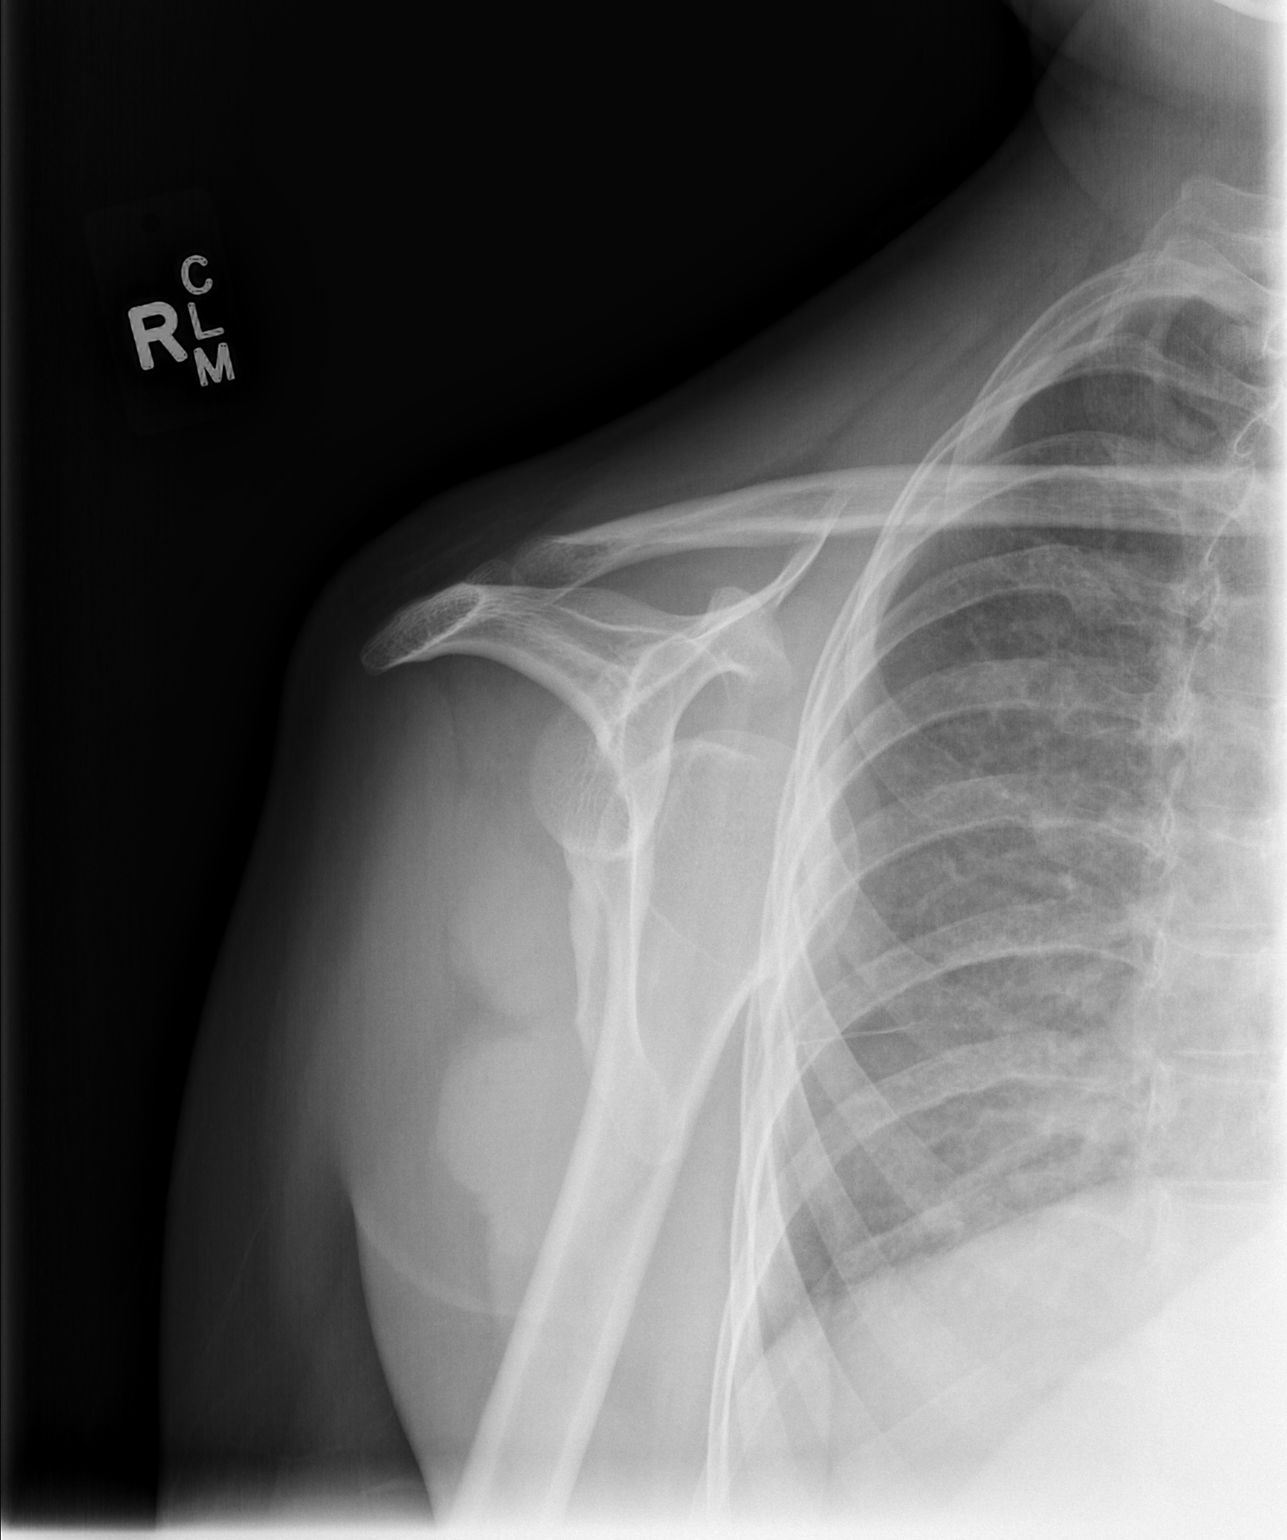

[2 of 2 positions shown; findings below may reference images not displayed]

FINDINGS: Osseous mineralization normal.
AC joint alignment normal.
Anterior right glenohumeral dislocation.
No definite fracture identified.
Visualized right ribs intact.
IMPRESSION: Anterior right glenohumeral dislocation.

## 2014-08-01 IMAGING — CR DG SHOULDER 2+V PORT*R*
2 series · 2 of 2 positions shown · non-contrast
Comparison: 12/29/2012

CLINICAL DATA: Right shoulder postreduction.

PORTABLE RIGHT SHOULDER - 2+ VIEW

[AP (1 of 2)]
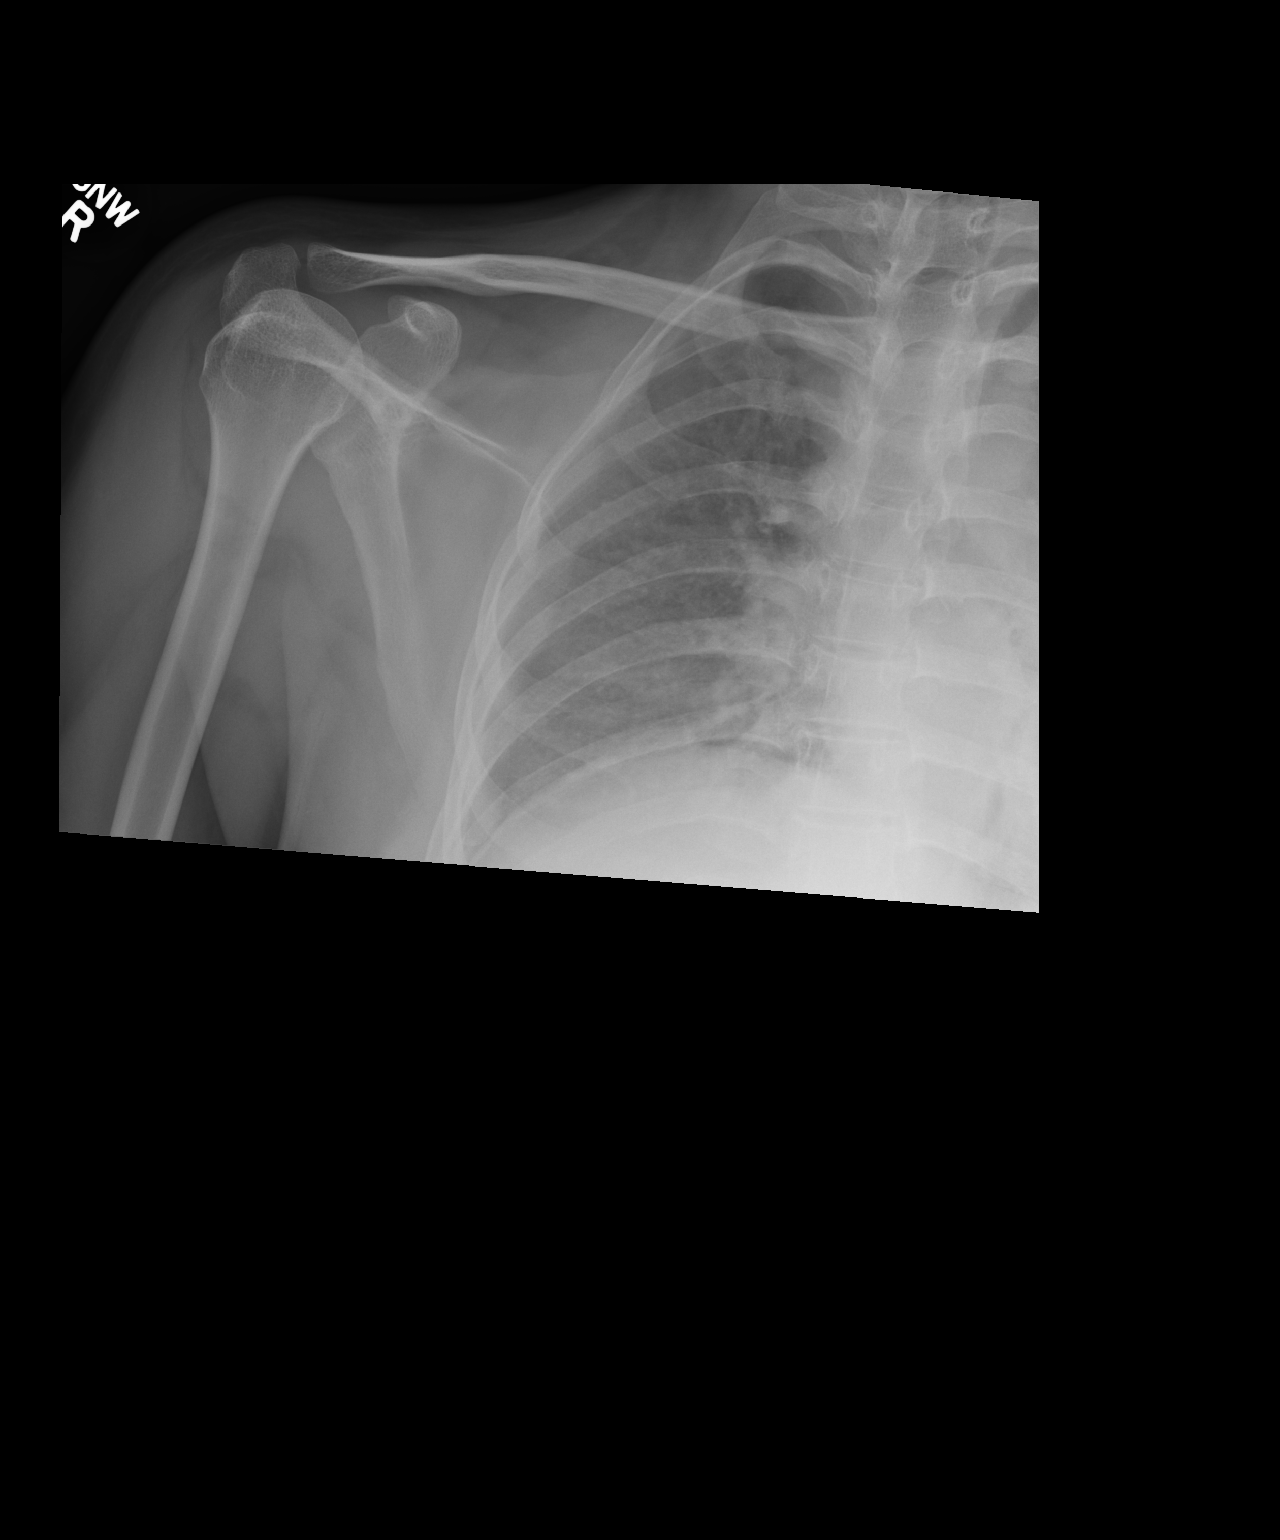

[AP (2 of 2)]
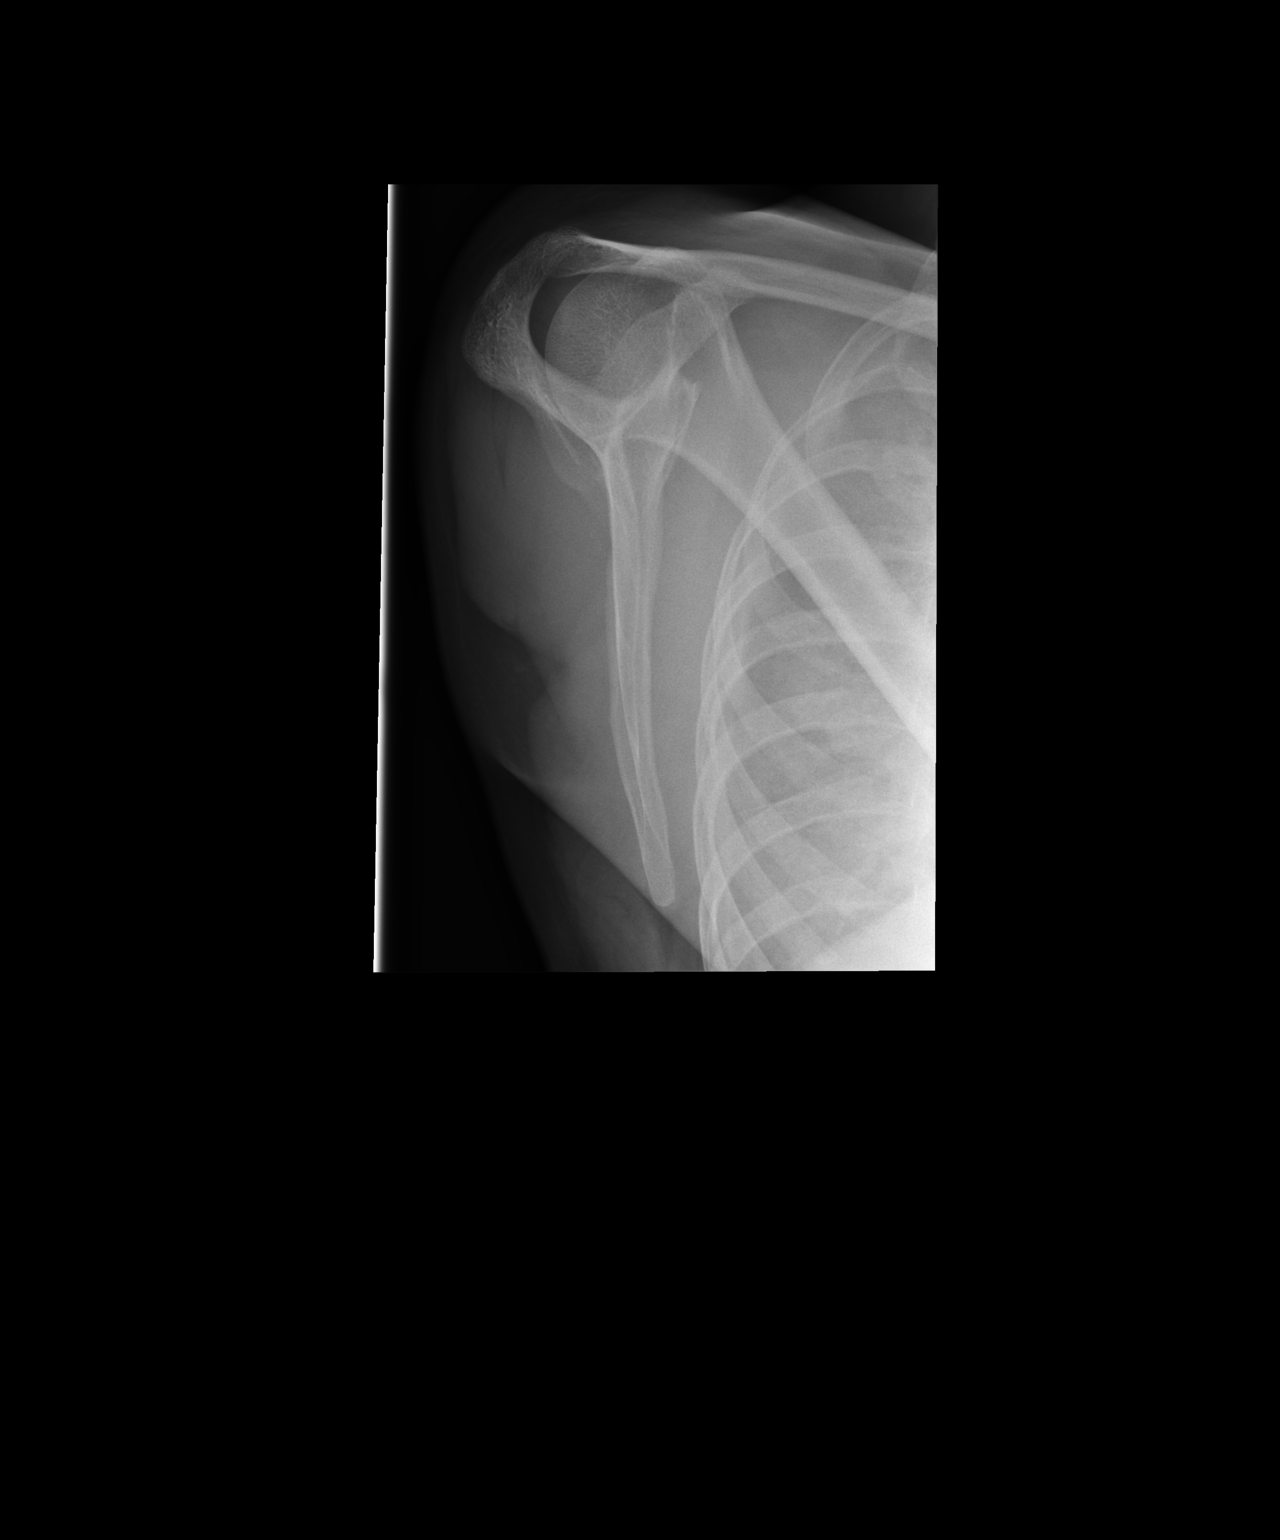

[2 of 2 positions shown; findings below may reference images not displayed]

FINDINGS: Interval relocation of the right shoulder since previous
study.  Glenohumeral joint appears intact.  Acromioclavicular and
coracoclavicular spaces are maintained.  No focal bone lesion
appreciated.
IMPRESSION: Interval reduction of previous right shoulder dislocation is
demonstrated.

## 2015-05-22 ENCOUNTER — Emergency Department (HOSPITAL_COMMUNITY)
Admission: EM | Admit: 2015-05-22 | Discharge: 2015-05-22 | Disposition: A | Discharge status: Home / Self Care | Payer: Self-pay | Attending: Emergency Medicine | Admitting: Emergency Medicine

## 2015-05-22 ENCOUNTER — Encounter (HOSPITAL_COMMUNITY): Payer: Self-pay | Admitting: Emergency Medicine

## 2015-05-22 DIAGNOSIS — J020 Streptococcal pharyngitis: Secondary | ICD-10-CM | POA: Insufficient documentation

## 2015-05-22 DIAGNOSIS — R509 Fever, unspecified: Secondary | ICD-10-CM

## 2015-05-22 DIAGNOSIS — J029 Acute pharyngitis, unspecified: Secondary | ICD-10-CM

## 2015-05-22 DIAGNOSIS — F17210 Nicotine dependence, cigarettes, uncomplicated: Secondary | ICD-10-CM | POA: Insufficient documentation

## 2015-05-22 LAB — RAPID STREP SCREEN (MED CTR MEBANE ONLY): Streptococcus, Group A Screen (Direct): NEGATIVE

## 2015-05-22 MED ORDER — IBUPROFEN 200 MG PO TABS
600.0000 mg | ORAL_TABLET | Freq: Once | ORAL | Status: AC
  Administered 2015-05-22: 600 mg via ORAL
  Filled 2015-05-22: qty 3

## 2015-05-22 MED ORDER — PENICILLIN G BENZATHINE 1200000 UNIT/2ML IM SUSP
1.2000 10*6.[IU] | Freq: Once | INTRAMUSCULAR | Status: AC
  Administered 2015-05-22: 1.2 10*6.[IU] via INTRAMUSCULAR
  Filled 2015-05-22: qty 2

## 2015-05-22 MED ORDER — ACETAMINOPHEN 325 MG PO TABS
650.0000 mg | ORAL_TABLET | Freq: Once | ORAL | Status: AC | PRN
  Administered 2015-05-22: 650 mg via ORAL
  Filled 2015-05-22: qty 2

## 2015-05-22 MED ORDER — DEXAMETHASONE 1 MG/ML PO CONC
10.0000 mg | Freq: Once | ORAL | Status: AC
  Administered 2015-05-22: 10 mg via ORAL
  Filled 2015-05-22: qty 10

## 2015-05-22 NOTE — ED Notes (Signed)
Pt. Stated, I've had sore throat with fever since yesterday night.

## 2015-05-22 NOTE — ED Provider Notes (Signed)
CSN: [id number]     Arrival date & time 05/22/15  1030 History   First MD Initiated Contact with Patient 05/22/15 1128     Chief Complaint  Patient presents with  . Sore Throat  . Fever     (Consider location/radiation/quality/duration/timing/severity/associated sxs/prior Treatment) HPI Comments: Sore throat, severe, left side since last night Headache, top of head, worse with head movements Fever Ibuprofen No other medical problems or allergies   Patient is a 29 y.o. male presenting with pharyngitis and fever.  Sore Throat Associated symptoms include headaches. Pertinent negatives include no chest pain, no abdominal pain and no shortness of breath.  Fever Associated symptoms: headaches and sore throat   Associated symptoms: no chest pain, no diarrhea, no dysuria, no nausea, no rash and no vomiting     Past Medical History  Diagnosis Date  . Shoulder dislocation     Right   History reviewed. No pertinent past surgical history. No family history on file. Social History  Substance Use Topics  . Smoking status: Current Every Day Smoker -- 0.50 packs/day    Types: Cigarettes  . Smokeless tobacco: None  . Alcohol Use: Yes     Comment: occasional    Review of Systems  Constitutional: Positive for fever and fatigue.  HENT: Positive for sore throat. Negative for trouble swallowing and voice change.   Eyes: Negative for visual disturbance.  Respiratory: Negative for shortness of breath.   Cardiovascular: Negative for chest pain.  Gastrointestinal: Negative for nausea, vomiting, abdominal pain and diarrhea.  Genitourinary: Negative for dysuria and difficulty urinating.  Musculoskeletal: Negative for back pain and neck stiffness.  Skin: Negative for rash.  Neurological: Positive for headaches. Negative for syncope and speech difficulty.      Allergies  Review of patient's allergies indicates no known allergies.  Home Medications   Prior to Admission medications    Medication Sig Start Date End Date Taking? Authorizing Provider  ibuprofen (ADVIL,MOTRIN) 200 MG tablet Take 200 mg by mouth every 6 (six) hours as needed for mild pain.   Yes Historical Provider, MD  oxyCODONE-acetaminophen (PERCOCET/ROXICET) 5-325 MG per tablet Take 1-2 tablets by mouth every 6 (six) hours as needed for severe pain. May take 2 tablets PO q 6 hours for severe pain - Do not take with Tylenol as this tablet already contains tylenol 02/09/14   Jennifer Piepenbrink, PA-C   BP 108/76 mmHg  Pulse 99  Temp(Src) 99.6 F (37.6 C) (Oral)  Resp 18  SpO2 98% Physical Exam  Constitutional: He is oriented to person, place, and time. He appears well-developed and well-nourished. No distress.  HENT:  Head: Normocephalic and atraumatic.  Mouth/Throat: Oropharyngeal exudate present.  Eyes: Conjunctivae and EOM are normal.  Neck: Normal range of motion. No Brudzinski's sign and no Kernig's sign noted.  Cardiovascular: Normal rate, regular rhythm, normal heart sounds and intact distal pulses.  Exam reveals no gallop and no friction rub.   No murmur heard. Pulmonary/Chest: Effort normal and breath sounds normal. No stridor. No respiratory distress. He has no wheezes. He has no rales.  Abdominal: Soft. He exhibits no distension. There is no tenderness. There is no guarding.  Musculoskeletal: He exhibits no edema.  Lymphadenopathy:    He has cervical adenopathy.  Neurological: He is alert and oriented to person, place, and time.  Skin: Skin is warm and dry. He is not diaphoretic.  Nursing note and vitals reviewed.   ED Course  Procedures (including critical care time) Labs Review Labs  Reviewed  RAPID STREP SCREEN (NOT AT [facility])  CULTURE, GROUP A STREP    Imaging Review No results found. I have personally reviewed and evaluated these images and lab results as part of my medical decision-making.   EKG Interpretation None      MDM   Final diagnoses:  Fever, unspecified fever  cause  Sore throat  Strep throat, clinical concern for, culture pending   29 year old male with no static and medical history presents with concern of sore throat and fever.  There is no sign of peritonsillar abscess on exam. Patient has full range of motion of his neck, no stridor no drooling, is well-appearing and have low suspicion for epiglottitis, RPA or meningitis.  A strep screen was ordered prior to evaluation a patient, and was negative, however have a high clinical suspicion for strep given signs of exudate, lymphadenopathy, fever, and lack of cough.  DDx also includes EBV, however given duration of symptoms have higher suspicion for strep at this time. Throat cx sent, and pt given IM Bicillin, by mouth Decadron, ibuprofen and Tylenol with improvement of fever, vital signs, and symptoms.  Recommend outpt follow up. Patient discharged in stable condition with understanding of reasons to return.     Alvira Monday, MD 05/22/15 [phone]

## 2015-05-22 NOTE — Discharge Instructions (Signed)
Laryngitis  Laryngitis is swelling (inflammation) of your vocal cords. This causes hoarseness, coughing, loss of voice, sore throat, or a dry throat. When your vocal cords are inflamed, your voice sounds different.  Laryngitis can be temporary (acute) or long-term (chronic). Most cases of acute laryngitis improve with time. Chronic laryngitis is laryngitis that lasts for more than three weeks.  HOME CARE  · Drink enough fluid to keep your pee (urine) clear or pale yellow.  · Breathe in moist air. Use a humidifier if you live in a dry climate.  · Take medicines only as told by your doctor.  · Do not smoke cigarettes or electronic cigarettes. If you need help quitting, ask your doctor.  · Talk as little as possible. Also avoid whispering, which can cause vocal strain.  · Write instead of talking. Do this until your voice is back to normal.  GET HELP IF:  · You have a fever.  · Your pain is worse.  · You have trouble swallowing.  GET HELP RIGHT AWAY IF:  · You cough up blood.  · You have trouble breathing.     This information is not intended to replace advice given to you by your health care provider. Make sure you discuss any questions you have with your health care provider.     Document Released: 06/02/2011 Document Revised: 07/04/2014 Document Reviewed: 11/26/2013  Elsevier Interactive Patient Education ©2016 [employer]

## 2015-05-25 LAB — CULTURE, GROUP A STREP

## 2015-11-05 ENCOUNTER — Encounter (HOSPITAL_COMMUNITY): Payer: Self-pay | Admitting: Emergency Medicine

## 2015-11-05 ENCOUNTER — Ambulatory Visit (HOSPITAL_COMMUNITY)
Admission: EM | Admit: 2015-11-05 | Discharge: 2015-11-05 | Disposition: A | Discharge status: Home / Self Care | Payer: No Typology Code available for payment source | Attending: Family Medicine | Admitting: Family Medicine

## 2015-11-05 DIAGNOSIS — B354 Tinea corporis: Secondary | ICD-10-CM

## 2015-11-05 MED ORDER — KETOCONAZOLE 2 % EX CREA: 1.0000 "application " | TOPICAL_CREAM | Freq: Every day | CUTANEOUS | Status: AC

## 2015-11-05 MED ORDER — SELENIUM SULFIDE 2.5 % EX LOTN: 1.0000 "application " | TOPICAL_LOTION | CUTANEOUS | Status: AC

## 2015-11-05 NOTE — ED Notes (Signed)
Patient has rash on arms and torso.  Red, circular areas on abdomen and right back and scattered over arms of various sizes.  Other areas of rash are red and in a lone

## 2015-11-05 NOTE — ED Provider Notes (Signed)
CSN: [id number]     Arrival date & time 11/05/15  1555 History   First MD Initiated Contact with Patient 11/05/15 1648     Chief Complaint  Patient presents with  . Rash   (Consider location/radiation/quality/duration/timing/severity/associated sxs/prior Treatment) HPI Comments: 30 year old male states he developed a rash to his torso and primarily upper extremities possibly 3 days ago. Some of these lesions itch but relatively mildly. These are rough, annular lesions varying in size from half a centimeter to 3 cm. They are primarily isolated from the others. Patient is uncertain as to the etiology. Does not know of any contact to plan, chemical, food or other substances that may cause this. It does not involve the face. He states he feels generally well, no fatigue, malaise, fever or other sick symptoms.   Past Medical History  Diagnosis Date  . Shoulder dislocation     Right   History reviewed. No pertinent past surgical history. No family history on file. Social History  Substance Use Topics  . Smoking status: Current Every Day Smoker -- 0.50 packs/day    Types: Cigarettes  . Smokeless tobacco: None  . Alcohol Use: Yes     Comment: occasional    Review of Systems  Constitutional: Negative.   HENT: Negative.   Respiratory: Negative.   Musculoskeletal: Negative.   Skin: Positive for rash.  Neurological: Negative.   All other systems reviewed and are negative.   Allergies  Review of patient's allergies indicates no known allergies.  Home Medications   Prior to Admission medications   Medication Sig Start Date End Date Taking? Authorizing Provider  ibuprofen (ADVIL,MOTRIN) 200 MG tablet Take 200 mg by mouth every 6 (six) hours as needed for mild pain.    Historical Provider, MD  ketoconazole (NIZORAL) 2 % cream Apply 1 application topically daily. Apply daily to affected areas for 2 weeks 11/05/15   Hayden Rasmussen, NP  oxyCODONE-acetaminophen (PERCOCET/ROXICET) 5-325 MG per  tablet Take 1-2 tablets by mouth every 6 (six) hours as needed for severe pain. May take 2 tablets PO q 6 hours for severe pain - Do not take with Tylenol as this tablet already contains tylenol 02/09/14   Francee Piccolo, PA-C  selenium sulfide (SELSUN) 2.5 % shampoo Apply 1 application topically 2 (two) times a week. 11/05/15   Hayden Rasmussen, NP   Meds Ordered and Administered this Visit  Medications - No data to display  BP 152/103 mmHg  Pulse 88  Temp(Src) 97.9 F (36.6 C) (Oral)  Resp 14  SpO2 98% No data found.   Physical Exam  Constitutional: He is oriented to person, place, and time. He appears well-developed and well-nourished. No distress.  HENT:  Mouth/Throat: Oropharynx is clear and moist. No oropharyngeal exudate.  No intraoral enathema, erythema, swelling or other lesions.  Eyes: Conjunctivae and EOM are normal.  Neck: Normal range of motion. Neck supple.  Cardiovascular: Normal rate, regular rhythm and normal heart sounds.   Pulmonary/Chest: Effort normal and breath sounds normal. No respiratory distress. He has no wheezes. He has no rales.  Musculoskeletal: Normal range of motion. He exhibits no edema or tenderness.  Lymphadenopathy:    He has no cervical adenopathy.  Neurological: He is alert and oriented to person, place, and time. He exhibits normal muscle tone.  Skin: Skin is warm and dry. Rash noted.  The patient states the rash started on the right abdomen see photographs below. These of the second to largest lesions. There is a larger one  on the left upper back. These are primarily annular to avoid lesions. Erythematous base, rough texture, light central clearing with scaling around portions of the margins.  Psychiatric: He has a normal mood and affect.  Nursing note and vitals reviewed.   ED Course  Procedures (including critical care time)  Labs Review Labs Reviewed - No data to display  Imaging Review No results found.         Visual Acuity  Review  Right Eye Distance:   Left Eye Distance:   Bilateral Distance:    Right Eye Near:   Left Eye Near:    Bilateral Near:         MDM   1. Tinea corporis   Diff Tinea versicolor  Meds ordered this encounter  Medications  . ketoconazole (NIZORAL) 2 % cream    Sig: Apply 1 application topically daily. Apply daily to affected areas for 2 weeks    Dispense:  60 g    Refill:  0    Order Specific Question:  Supervising Provider    Answer:  Linna HoffKINDL, JAMES D [phone][[id number]]  . selenium sulfide (SELSUN) 2.5 % shampoo    Sig: Apply 1 application topically 2 (two) times a week.    Dispense:  120 mL    Refill:  1    Order Specific Question:  Supervising Provider    Answer:  Linna HoffKINDL, JAMES D [[id number]]       Hayden Rasmussenavid Lillyahna Hemberger, NP 11/05/15 1719

## 2015-11-05 NOTE — Discharge Instructions (Signed)

## 2019-01-15 ENCOUNTER — Other Ambulatory Visit: Payer: Self-pay

## 2019-01-15 DIAGNOSIS — Z20822 Contact with and (suspected) exposure to covid-19: Secondary | ICD-10-CM

## 2019-01-17 LAB — NOVEL CORONAVIRUS, NAA: SARS-CoV-2, NAA: NOT DETECTED

## 2023-09-08 ENCOUNTER — Other Ambulatory Visit: Payer: Self-pay

## 2023-09-08 ENCOUNTER — Emergency Department (HOSPITAL_BASED_OUTPATIENT_CLINIC_OR_DEPARTMENT_OTHER)
Admission: EM | Admit: 2023-09-08 | Discharge: 2023-09-08 | Disposition: A | Discharge status: Home / Self Care | Payer: Self-pay | Attending: Emergency Medicine | Admitting: Emergency Medicine

## 2023-09-08 DIAGNOSIS — H1033 Unspecified acute conjunctivitis, bilateral: Secondary | ICD-10-CM

## 2023-09-08 DIAGNOSIS — H1089 Other conjunctivitis: Secondary | ICD-10-CM | POA: Insufficient documentation

## 2023-09-08 DIAGNOSIS — H11422 Conjunctival edema, left eye: Secondary | ICD-10-CM | POA: Insufficient documentation

## 2023-09-08 MED ORDER — OFLOXACIN 0.3 % OP SOLN: 1.0000 [drp] | Freq: Four times a day (QID) | OPHTHALMIC | 0 refills | Status: AC

## 2023-09-08 MED ORDER — FLUORESCEIN SODIUM 1 MG OP STRP
1.0000 | ORAL_STRIP | Freq: Once | OPHTHALMIC | Status: AC
  Administered 2023-09-08: 1 via OPHTHALMIC
  Filled 2023-09-08: qty 1

## 2023-09-08 MED ORDER — TETRACAINE HCL 0.5 % OP SOLN
2.0000 [drp] | Freq: Once | OPHTHALMIC | Status: AC
  Administered 2023-09-08: 2 [drp] via OPHTHALMIC
  Filled 2023-09-08: qty 4

## 2023-09-08 NOTE — ED Provider Notes (Signed)
 [facility] Provider Note   CSN: [id number] Arrival date & time: 09/08/23  1025     History  Chief Complaint  Patient presents with   Conjunctivitis    Steven Richmond is a 38 y.o. male.  Presenting to the ED for evaluation of left eye pain and irritation.  He states he noticed this 3 days ago.  Has progressively worsened.  Yesterday he noticed pain to the right eye as well.  It is described as a gritty and foreign body sensation when he blinks.  States he wakes up in the morning with his left eye crusted over.  He has mucopurulent drainage throughout the day.  He denies any vision changes.  He does not wear contacts.  No trauma.  No exposure to irritants.  Patient speaks [place], Nepali interpreter was used   Conjunctivitis       Home Medications Prior to Admission medications   Medication Sig Start Date End Date Taking? Authorizing Provider  ofloxacin (OCUFLOX) 0.3 % ophthalmic solution Place 1 drop into both eyes 4 (four) times daily. 09/08/23  Yes Haden Suder, Edsel Petrin, PA-C  ibuprofen (ADVIL,MOTRIN) 200 MG tablet Take 200 mg by mouth every 6 (six) hours as needed for mild pain.    [provider]  ketoconazole (NIZORAL) 2 % cream Apply 1 application topically daily. Apply daily to affected areas for 2 weeks 11/05/15   Hayden Rasmussen, NP  oxyCODONE-acetaminophen (PERCOCET/ROXICET) 5-325 MG per tablet Take 1-2 tablets by mouth every 6 (six) hours as needed for severe pain. May take 2 tablets PO q 6 hours for severe pain - Do not take with Tylenol as this tablet already contains tylenol 02/09/14   Piepenbrink, Victorino Dike, PA-C  selenium sulfide (SELSUN) 2.5 % shampoo Apply 1 application topically 2 (two) times a week. 11/05/15   Hayden Rasmussen, NP      Allergies    Patient has no known allergies.    Review of Systems   Review of Systems  Eyes:  Positive for pain, discharge, redness and itching.  All other systems reviewed and are  negative.   Physical Exam Updated Vital Signs BP (!) 119/90 (BP Location: Left Arm)   Pulse 78   Temp 98.1 F (36.7 C) (Oral)   Resp 16   SpO2 99%  Physical Exam Vitals and nursing note reviewed.  Constitutional:      General: He is not in acute distress.    Appearance: Normal appearance. He is normal weight. He is not ill-appearing.  HENT:     Head: Normocephalic and atraumatic.  Eyes:     Intraocular pressure: Right eye pressure is 19 mmHg. Left eye pressure is 16 mmHg.     Comments: Bilateral conjunctival injection, left-sided chemosis, does not extend over the iris.  Pupils 3 mm, equal and reactive bilaterally.  No pain with constriction or dilation.  No pain with EOM.  Excessive tearing on the left.  No surrounding swelling or erythema.  No fluorescein uptake.  Negative Seidel sign.  Pulmonary:     Effort: Pulmonary effort is normal. No respiratory distress.  Abdominal:     General: Abdomen is flat.  Musculoskeletal:        General: Normal range of motion.     Cervical back: Neck supple.  Skin:    General: Skin is warm and dry.  Neurological:     Mental Status: He is alert and oriented to person, place, and time.  Psychiatric:  Mood and Affect: Mood normal.        Behavior: Behavior normal.     ED Results / Procedures / Treatments   Labs (all labs ordered are listed, but only abnormal results are displayed) Labs Reviewed - No data to display  EKG None  Radiology No results found.  Procedures Procedures    Medications Ordered in ED Medications  fluorescein ophthalmic strip 1 strip (1 strip Both Eyes Given by Other 09/08/23 1158)  tetracaine (PONTOCAINE) 0.5 % ophthalmic solution 2 drop (2 drops Both Eyes Given by Other 09/08/23 1158)    ED Course/ Medical Decision Making/ A&P Clinical Course as of 09/08/23 1409  Fri Sep 08, 2023  1346 Spoke with ophthalmology Dr. Vanessa Barbara who recommends ofloxacin, artificial tears, follow-up in office next week only  if not improving [AS]    Clinical Course User Index [AS] Michelle Piper, PA-C                                 Medical Decision Making Risk Prescription drug management.  This patient presents to the ED for concern of left eye irritation, this involves an extensive number of treatment options, and is a complaint that carries with it a high risk of complications and morbidity. The emergent differential diagnosis for acute eye pain includes, but is not limited to ocular ischemia, optic neuritis, temporal arteritis, Sinusitis, neuralgia, Migraine, Acute angle closure glaucoma, eye trauma, uveitis, iritis, corneal abrasion/ulceration, and photokeratitis.   My initial workup includes visual acuity, fluorescein staining, tonometry  Additional history obtained from: Nursing notes from this visit.  38 year old male presenting to the ED for evaluation of bilateral eye pain, left worse than right.  Described as a itchy, gritty sensation like a foreign body is under his eyelid.  Reports mucopurulent drainage and matting of his eyes in the morning as well.  No vision issues.  On exam, there is significant conjunctival injection bilaterally with left-sided chemosis.  He is able to close his eyes.  Fluorescein stain negative.  Pressures are normal.  Visual acuity normal.  Suspect conjunctivitis.  Discussed case with Dr. Vanessa Barbara with ophthalmology who recommends antibiotic, artificial tears, follow-up in office on Monday if not improving.  Discussed this plan with the patient who is in agreement.  He was given return precautions.  Stable at discharge.  At this time there does not appear to be any evidence of an acute emergency medical condition and the patient appears stable for discharge with appropriate outpatient follow up. Diagnosis was discussed with patient who verbalizes understanding of care plan and is agreeable to discharge. I have discussed return precautions with patient who verbalizes  understanding. Patient encouraged to follow-up with their PCP within 1 week. All questions answered.  Note: Portions of this report may have been transcribed using voice recognition software. Every effort was made to ensure accuracy; however, inadvertent computerized transcription errors may still be present.        Final Clinical Impression(s) / ED Diagnoses Final diagnoses:  Acute bacterial conjunctivitis of both eyes  Chemosis of left conjunctiva    Rx / DC Orders ED Discharge Orders          Ordered    ofloxacin (OCUFLOX) 0.3 % ophthalmic solution  4 times daily        09/08/23 1407              Michelle Piper, [place] 09/08/23 1409  Elayne Snare K, DO 09/08/23 1453

## 2023-09-08 NOTE — ED Triage Notes (Signed)
 Pt states that on 3/10 he woke up from sleep and had very bad eye redness and swelling L>R. Pt states it is painful and having drainage. Pt denies vision changes.

## 2023-09-08 NOTE — Discharge Instructions (Signed)
 You have been seen today for your complaint of eye pain. Your discharge medications include ofloxacin.  This is an antibiotic.  Take this as prescribed until your symptoms are gone for 2 days. Home care instructions are as follows:  Get eyedrops over-the-counter and place them in the fridge, use these throughout the day when needed for irritation of the eye Follow up with: Dr. Vanessa Barbara.  Call on Monday to schedule appointment only if your symptoms do not improve Please seek immediate medical care if you develop any of the following symptoms: You have a fever and your symptoms suddenly get worse. You have severe pain when you move your eye. You have facial pain, redness, or swelling. You have a sudden loss of vision. At this time there does not appear to be the presence of an emergent medical condition, however there is always the potential for conditions to change. Please read and follow the below instructions.  Do not take your medicine if  develop an itchy rash, swelling in your mouth or lips, or difficulty breathing; call 911 and seek immediate emergency medical attention if this occurs.  You may review your lab tests and imaging results in their entirety on your MyChart account.  Please discuss all results of fully with your primary care provider and other specialist at your follow-up visit.  Note: Portions of this text may have been transcribed using voice recognition software. Every effort was made to ensure accuracy; however, inadvertent computerized transcription errors may still be present.
